# Patient Record
Sex: Female | Born: 1990 | Race: White | Hispanic: No | Marital: Married | Smoking: Current every day smoker
Health system: Southern US, Community
[De-identification: ages and names within clinical notes are randomized; demographics above are authoritative.]

## PROBLEM LIST (undated history)

## (undated) ENCOUNTER — Inpatient Hospital Stay (HOSPITAL_COMMUNITY): Payer: Self-pay

## (undated) DIAGNOSIS — Z8619 Personal history of other infectious and parasitic diseases: Secondary | ICD-10-CM

## (undated) DIAGNOSIS — E119 Type 2 diabetes mellitus without complications: Secondary | ICD-10-CM

## (undated) DIAGNOSIS — N200 Calculus of kidney: Secondary | ICD-10-CM

## (undated) DIAGNOSIS — B0089 Other herpesviral infection: Secondary | ICD-10-CM

## (undated) DIAGNOSIS — O24419 Gestational diabetes mellitus in pregnancy, unspecified control: Secondary | ICD-10-CM

## (undated) HISTORY — DX: Personal history of other infectious and parasitic diseases: Z86.19

## (undated) HISTORY — DX: Type 2 diabetes mellitus without complications: E11.9

## (undated) HISTORY — DX: Other herpesviral infection: B00.89

---
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
## 2011-07-30 ENCOUNTER — Encounter (HOSPITAL_COMMUNITY): Payer: Self-pay | Admitting: Emergency Medicine

## 2011-07-30 ENCOUNTER — Emergency Department (HOSPITAL_COMMUNITY): Payer: No Typology Code available for payment source

## 2011-07-30 ENCOUNTER — Emergency Department (HOSPITAL_COMMUNITY)
Admission: EM | Admit: 2011-07-30 | Discharge: 2011-07-30 | Disposition: A | Discharge status: Home / Self Care | Payer: No Typology Code available for payment source | Attending: Emergency Medicine | Admitting: Emergency Medicine

## 2011-07-30 DIAGNOSIS — IMO0002 Reserved for concepts with insufficient information to code with codable children: Secondary | ICD-10-CM | POA: Insufficient documentation

## 2011-07-30 DIAGNOSIS — S32009A Unspecified fracture of unspecified lumbar vertebra, initial encounter for closed fracture: Secondary | ICD-10-CM | POA: Insufficient documentation

## 2011-07-30 DIAGNOSIS — S32000A Wedge compression fracture of unspecified lumbar vertebra, initial encounter for closed fracture: Secondary | ICD-10-CM

## 2011-07-30 DIAGNOSIS — R404 Transient alteration of awareness: Secondary | ICD-10-CM | POA: Insufficient documentation

## 2011-07-30 DIAGNOSIS — T07XXXA Unspecified multiple injuries, initial encounter: Secondary | ICD-10-CM

## 2011-07-30 DIAGNOSIS — R109 Unspecified abdominal pain: Secondary | ICD-10-CM | POA: Insufficient documentation

## 2011-07-30 LAB — POCT PREGNANCY, URINE: Preg Test, Ur: NEGATIVE

## 2011-07-30 LAB — CBC
HCT: 43.5 % (ref 36.0–46.0)
Hemoglobin: 15.3 g/dL — ABNORMAL HIGH (ref 12.0–15.0)
MCV: 87.9 fL (ref 78.0–100.0)
RBC: 4.95 MIL/uL (ref 3.87–5.11)
RDW: 12.9 % (ref 11.5–15.5)

## 2011-07-30 LAB — BASIC METABOLIC PANEL
BUN: 10 mg/dL (ref 6–23)
CO2: 22 mEq/L (ref 19–32)
Chloride: 107 mEq/L (ref 96–112)
Creatinine, Ser: 0.44 mg/dL — ABNORMAL LOW (ref 0.50–1.10)
Glucose, Bld: 95 mg/dL (ref 70–99)

## 2011-07-30 MED ORDER — HYDROCODONE-ACETAMINOPHEN 5-500 MG PO TABS: 1.0000 | ORAL_TABLET | Freq: Four times a day (QID) | ORAL | Status: AC | PRN

## 2011-07-30 MED ORDER — IOHEXOL 300 MG/ML  SOLN
100.0000 mL | Freq: Once | INTRAMUSCULAR | Status: AC | PRN
  Administered 2011-07-30: 100 mL via INTRAVENOUS

## 2011-07-30 MED ORDER — NAPROXEN 500 MG PO TABS: 500.0000 mg | ORAL_TABLET | Freq: Two times a day (BID) | ORAL | Status: DC
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
## 2012-02-01 LAB — OB RESULTS CONSOLE GC/CHLAMYDIA
Chlamydia: NEGATIVE
Gonorrhea: NEGATIVE
Gonorrhea: NEGATIVE

## 2012-02-01 LAB — OB RESULTS CONSOLE RPR: RPR: NONREACTIVE

## 2012-02-01 LAB — OB RESULTS CONSOLE HIV ANTIBODY (ROUTINE TESTING): HIV: NONREACTIVE

## 2012-05-18 ENCOUNTER — Inpatient Hospital Stay (HOSPITAL_COMMUNITY)
Admission: AD | Admit: 2012-05-18 | Discharge: 2012-05-18 | Disposition: A | Discharge status: Home / Self Care | Payer: BC Managed Care – PPO | Source: Ambulatory Visit | Attending: Obstetrics and Gynecology | Admitting: Obstetrics and Gynecology

## 2012-05-18 ENCOUNTER — Encounter (HOSPITAL_COMMUNITY): Payer: Self-pay

## 2012-05-18 DIAGNOSIS — K5289 Other specified noninfective gastroenteritis and colitis: Secondary | ICD-10-CM | POA: Insufficient documentation

## 2012-05-18 DIAGNOSIS — G5600 Carpal tunnel syndrome, unspecified upper limb: Secondary | ICD-10-CM | POA: Insufficient documentation

## 2012-05-18 DIAGNOSIS — E876 Hypokalemia: Secondary | ICD-10-CM | POA: Insufficient documentation

## 2012-05-18 DIAGNOSIS — O99891 Other specified diseases and conditions complicating pregnancy: Secondary | ICD-10-CM | POA: Insufficient documentation

## 2012-05-18 DIAGNOSIS — G5603 Carpal tunnel syndrome, bilateral upper limbs: Secondary | ICD-10-CM

## 2012-05-18 DIAGNOSIS — IMO0002 Reserved for concepts with insufficient information to code with codable children: Secondary | ICD-10-CM | POA: Insufficient documentation

## 2012-05-18 DIAGNOSIS — K529 Noninfective gastroenteritis and colitis, unspecified: Secondary | ICD-10-CM

## 2012-05-18 DIAGNOSIS — R209 Unspecified disturbances of skin sensation: Secondary | ICD-10-CM | POA: Insufficient documentation

## 2012-05-18 HISTORY — DX: Calculus of kidney: N20.0

## 2012-05-18 LAB — CBC WITH DIFFERENTIAL/PLATELET
Basophils Absolute: 0 10*3/uL (ref 0.0–0.1)
Basophils Relative: 0 % (ref 0–1)
Eosinophils Absolute: 0 10*3/uL (ref 0.0–0.7)
HCT: 27.8 % — ABNORMAL LOW (ref 36.0–46.0)
Hemoglobin: 9.5 g/dL — ABNORMAL LOW (ref 12.0–15.0)
Lymphs Abs: 1.2 10*3/uL (ref 0.7–4.0)
MCHC: 34.2 g/dL (ref 30.0–36.0)
MCV: 86.1 fL (ref 78.0–100.0)
Monocytes Absolute: 0.6 10*3/uL (ref 0.1–1.0)
Monocytes Relative: 5 % (ref 3–12)

## 2012-05-18 LAB — COMPREHENSIVE METABOLIC PANEL
ALT: 15 U/L (ref 0–35)
Albumin: 2.5 g/dL — ABNORMAL LOW (ref 3.5–5.2)
Alkaline Phosphatase: 71 U/L (ref 39–117)
Calcium: 7.7 mg/dL — ABNORMAL LOW (ref 8.4–10.5)
GFR calc non Af Amer: >90 mL/min (ref >90)
Potassium: 2.5 mEq/L — CL (ref 3.5–5.1)
Total Bilirubin: 0.3 mg/dL (ref 0.3–1.2)

## 2012-05-18 LAB — URINALYSIS, ROUTINE W REFLEX MICROSCOPIC
Hgb urine dipstick: NEGATIVE
Leukocytes, UA: NEGATIVE
Specific Gravity, Urine: 1.01 (ref 1.005–1.030)
Urobilinogen, UA: 0.2 mg/dL (ref 0.0–1.0)

## 2012-05-18 MED ORDER — POTASSIUM CHLORIDE CRYS ER 20 MEQ PO TBCR
40.0000 meq | EXTENDED_RELEASE_TABLET | Freq: Once | ORAL | Status: AC
  Administered 2012-05-18: 40 meq via ORAL
  Filled 2012-05-18: qty 2

## 2012-05-18 MED ORDER — HYDROCODONE-ACETAMINOPHEN 5-325 MG PO TABS
2.0000 | ORAL_TABLET | Freq: Once | ORAL | Status: AC
  Administered 2012-05-18: 2 via ORAL
  Filled 2012-05-18: qty 2

## 2012-05-18 MED ORDER — CARPAL TUNNEL WRIST STABILIZER MISC: 1.0000 | Freq: Every day | Status: DC

## 2012-05-18 NOTE — MAU Note (Signed)
Readjusted cardio fhr 150

## 2012-05-18 NOTE — MAU Provider Note (Signed)
History     CSN: [id number]  Arrival date and time: 05/18/12 2011   First Provider Initiated Contact with Patient 05/18/12 2103      No chief complaint on file.  HPI 21 y.o. G1P0 at 109w2d with swelling and tingling in hands bilaterally for a few days. Started yesterday with mild swelling and a few episodes of tingling relieved when she got up and moved around. Today her hands feel very swollen and pain is constant - pins and needles- worse with any movement and stiff and swollen from wrist down. Any movement cause tingling/burning pain. Hard to move fingers. Has been sick with nausea/vomiting last night - other family members have had same illness.  No headaches except right after vomiting. Feels like eyes are twitching. Felt hot at home, but did not take temp. No dysuria. No ctx, bleeding, lof. Baby moving well.   OB History    Grav Para Term Preterm Abortions TAB SAB Ect Mult Living   1               Past Medical History  Diagnosis Date  . Kidney stones     age 21    No past surgical history on file.  Fam Hx:  HTN mom and dad  History  Substance Use Topics  . Smoking status: Current Every Day Smoker - 1 cig a day  . Smokeless tobacco: Not on file  . Alcohol Use: No    Allergies: No Known Allergies  Prescriptions prior to admission  Medication Sig Dispense Refill  . naproxen (NAPROSYN) 500 MG tablet Take 1 tablet (500 mg total) by mouth 2 (two) times daily with a meal.  30 tablet  0    Review of Systems  Constitutional: Negative for fever and chills.  Eyes: Negative for blurred vision and double vision.  Respiratory: Negative for cough, shortness of breath and wheezing.   Gastrointestinal: Positive for nausea, vomiting and diarrhea. Negative for abdominal pain and constipation.  Genitourinary: Negative for dysuria, urgency and frequency.  Musculoskeletal: Negative for back pain and joint pain.  Skin: Negative for rash.  Neurological: Negative for dizziness,  speech change, focal weakness, seizures, loss of consciousness and headaches.   Physical Exam   Blood pressure 145/65, pulse 107, temperature 98.5 F (36.9 C), resp. rate 18, last menstrual period 06/28/2011.  Physical Exam  Constitutional: She is oriented to person, place, and time. She appears well-developed and well-nourished. No distress.  HENT:  Head: Normocephalic and atraumatic.  Eyes: EOM are normal.       Left eye with small conjunctival hemorrhage. Otherwise normal.  Neck: Normal range of motion. Neck supple.       Non-tender.  Cardiovascular:       Mild tachycardia, regular, systolic flow murmur.  Respiratory: Effort normal and breath sounds normal. No respiratory distress. She has no wheezes. She has no rales.  GI: Soft. Bowel sounds are normal. There is no tenderness. There is no rebound and no guarding.  Musculoskeletal:       Holding hands flexed at wrist, fingers flexed at MCP joint but straight at PIP and DIP. Thumbs are flexed at MCP joints as well. Can flex and extend wrist and fingers at all joints but states it makes the tingling/numbness much worse. Stiff with passive range of motion. Joints not swollen, warm or painful. Bilateral edema - not warm or red. Feels "funny" when palpating elbows both condyles - feels more tingling in hands. Pulses intact. Sensation intact. Lower extremities  trace edema, non-tender, normal pulses.  Neurological: She is alert and oriented to person, place, and time.  Skin: Skin is warm and dry.  Psychiatric: She has a normal mood and affect.    FHTs:  145, moderate variability, accels present, occasional variable, no CTX.  Results for orders placed during the hospital encounter of 05/18/12 (from the past 24 hour(s))  URINALYSIS, ROUTINE W REFLEX MICROSCOPIC     Status: Normal   Collection Time   05/18/12  8:37 PM      Component Value Range   Color, Urine YELLOW  YELLOW   APPearance CLEAR  CLEAR   Specific Gravity, Urine 1.010  1.005  - 1.030   pH 7.0  5.0 - 8.0   Glucose, UA NEGATIVE  NEGATIVE mg/dL   Hgb urine dipstick NEGATIVE  NEGATIVE   Bilirubin Urine NEGATIVE  NEGATIVE   Ketones, ur NEGATIVE  NEGATIVE mg/dL   Protein, ur NEGATIVE  NEGATIVE mg/dL   Urobilinogen, UA 0.2  0.0 - 1.0 mg/dL   Nitrite NEGATIVE  NEGATIVE   Leukocytes, UA NEGATIVE  NEGATIVE  CBC WITH DIFFERENTIAL     Status: Abnormal   Collection Time   05/18/12  9:39 PM      Component Value Range   WBC 12.7 (*) 4.0 - 10.5 K/uL   RBC 3.23 (*) 3.87 - 5.11 MIL/uL   Hemoglobin 9.5 (*) 12.0 - 15.0 g/dL   HCT 16.1 (*) 09.6 - 04.5 %   MCV 86.1  78.0 - 100.0 fL   MCH 29.4  26.0 - 34.0 pg   MCHC 34.2  30.0 - 36.0 g/dL   RDW 40.9  81.1 - 91.4 %   Platelets 284  150 - 400 K/uL   Neutrophils Relative 85 (*) 43 - 77 %   Neutro Abs 10.8 (*) 1.7 - 7.7 K/uL   Lymphocytes Relative 10 (*) 12 - 46 %   Lymphs Abs 1.2  0.7 - 4.0 K/uL   Monocytes Relative 5  3 - 12 %   Monocytes Absolute 0.6  0.1 - 1.0 K/uL   Eosinophils Relative 0  0 - 5 %   Eosinophils Absolute 0.0  0.0 - 0.7 K/uL   Basophils Relative 0  0 - 1 %   Basophils Absolute 0.0  0.0 - 0.1 K/uL  COMPREHENSIVE METABOLIC PANEL     Status: Abnormal   Collection Time   05/18/12  9:39 PM      Component Value Range   Sodium 136  135 - 145 mEq/L   Potassium 2.5 (*) 3.5 - 5.1 mEq/L   Chloride 98  96 - 112 mEq/L   CO2 27  19 - 32 mEq/L   Glucose, Bld 87  70 - 99 mg/dL   BUN 7  6 - 23 mg/dL   Creatinine, Ser 7.82 (*) 0.50 - 1.10 mg/dL   Calcium 7.7 (*) 8.4 - 10.5 mg/dL   Total Protein 5.9 (*) 6.0 - 8.3 g/dL   Albumin 2.5 (*) 3.5 - 5.2 g/dL   AST 18  0 - 37 U/L   ALT 15  0 - 35 U/L   Alkaline Phosphatase 71  39 - 117 U/L   Total Bilirubin 0.3  0.3 - 1.2 mg/dL   GFR calc non Af Amer >90  >90 mL/min   GFR calc Af Amer >90  >90 mL/min    Filed Vitals:   05/18/12 2049 05/18/12 2139 05/18/12 2321 05/18/12 2322  BP: 145/65 125/52 126/58 126/58  Pulse: 107  96 91 91  Temp: 98.5 F (36.9 C)   98.8 F  (37.1 C)  Resp: 18  16 16      MAU Course  Procedures  Vicodin and ice pack given in MAU. Patient states the ice helped a lot.   Assessment and Plan  21 y.o. G1P0 at [redacted]w[redacted]d with - Swelling and neuropathy in hands bilaterally - likely carpal tunnel syndrome. Rx for bilateral wrist splints given. - Hypokalemia - likely from gastroenteritis. Replaced with 40 meq PO. F/U in clinic Monday to check electrolytes - Gastroenteritis - appears to be improving. Encouraged fluids, bland diet until tolerating PO. - Initially elevated BP but all repeat BP normal. Recheck in clinic next visit.  Discussed with Dr. Claiborne Billings. Discharge home.  Napoleon Form 05/18/2012, 9:05 PM

## 2012-05-18 NOTE — MAU Note (Signed)
Onset of swelling in hands started a couple of days has been in bed with a stomach virus, worse today, feels tingling, hurts can't bend hands.

## 2012-05-18 NOTE — MAU Note (Signed)
Received critical value on Potassium 2.5 notified Dr. Thad Ranger.

## 2012-05-22 NOTE — L&D Delivery Note (Signed)
Delivery Note At 2:50 PM a viable and healthy female was delivered via Vaginal, Vacuum extractor at outlet to shorten the 2nd stage.  (Presentation: Right Occiput Anterior).  APGAR: 8, 9; weight 7 lb 1.6 oz (3220 g).   Placenta status: Intact, Spontaneous.  Cord: 3 vessels.  Anesthesia: Epidural  Episiotomy: None Lacerations: 2nd degree;Perineal Suture Repair: 3.0 chromic Est. Blood Loss (mL): 300  Mom to postpartum.  Baby to nursery-stable.  KAPLAN,RICHARD D 07/30/2012, 5:50 PM

## 2012-06-12 ENCOUNTER — Encounter: Payer: Self-pay | Admitting: *Deleted

## 2012-06-12 ENCOUNTER — Encounter: Payer: Medicaid Other | Attending: Obstetrics and Gynecology | Admitting: *Deleted

## 2012-06-12 DIAGNOSIS — Z713 Dietary counseling and surveillance: Secondary | ICD-10-CM | POA: Insufficient documentation

## 2012-06-12 DIAGNOSIS — O99810 Abnormal glucose complicating pregnancy: Secondary | ICD-10-CM | POA: Insufficient documentation

## 2012-06-12 NOTE — Patient Instructions (Signed)
Goals:  Check glucose levels per MD as instructed  Follow Gestational Diabetes Diet as instructed  Call for follow-up as needed    

## 2012-06-12 NOTE — Progress Notes (Signed)
  Patient was seen on 06/12/2012 for Gestational Diabetes self-management class at the [facility]. The following learning objectives were met by the patient during this course:   States the definition of Gestational Diabetes  States why dietary management is important in controlling blood glucose  Describes the effects each nutrient has on blood glucose levels  Demonstrates ability to create a balanced meal plan  Demonstrates carbohydrate counting   States when to check blood glucose levels  Demonstrates proper blood glucose monitoring techniques  States the effect of stress and exercise on blood glucose levels  States the importance of limiting caffeine and abstaining from alcohol and smoking  Blood glucose monitor given: Accu Chek Nano BG Monitoring Kit Lot # [id number] Exp: 09/18/13 Blood glucose reading: 99 mg/dl  Patient instructed to monitor glucose levels: FBS: 60 - <90 2 hour: <120  *Patient received handouts:  Nutrition Diabetes and Pregnancy  Carbohydrate Counting List  Patient will be seen for follow-up as needed.

## 2012-07-06 ENCOUNTER — Observation Stay (HOSPITAL_COMMUNITY)
Admission: AD | Admit: 2012-07-06 | Discharge: 2012-07-06 | Disposition: A | Discharge status: Home / Self Care | Payer: BC Managed Care – PPO | Source: Ambulatory Visit | Attending: Obstetrics and Gynecology | Admitting: Obstetrics and Gynecology

## 2012-07-06 ENCOUNTER — Encounter (HOSPITAL_COMMUNITY): Payer: Self-pay | Admitting: *Deleted

## 2012-07-06 DIAGNOSIS — O4700 False labor before 37 completed weeks of gestation, unspecified trimester: Principal | ICD-10-CM | POA: Insufficient documentation

## 2012-07-06 DIAGNOSIS — R030 Elevated blood-pressure reading, without diagnosis of hypertension: Secondary | ICD-10-CM | POA: Insufficient documentation

## 2012-07-06 DIAGNOSIS — O99891 Other specified diseases and conditions complicating pregnancy: Secondary | ICD-10-CM | POA: Insufficient documentation

## 2012-07-06 DIAGNOSIS — O479 False labor, unspecified: Secondary | ICD-10-CM

## 2012-07-06 DIAGNOSIS — O99810 Abnormal glucose complicating pregnancy: Secondary | ICD-10-CM | POA: Insufficient documentation

## 2012-07-06 LAB — URINE MICROSCOPIC-ADD ON

## 2012-07-06 LAB — URINALYSIS, ROUTINE W REFLEX MICROSCOPIC
Bilirubin Urine: NEGATIVE
Ketones, ur: 15 mg/dL — AB
Leukocytes, UA: NEGATIVE
Nitrite: NEGATIVE
Urobilinogen, UA: 0.2 mg/dL (ref 0.0–1.0)

## 2012-07-06 LAB — LACTATE DEHYDROGENASE: LDH: 183 U/L (ref 94–250)

## 2012-07-06 LAB — CBC
Platelets: 338 10*3/uL (ref 150–400)
RDW: 13.4 % (ref 11.5–15.5)
WBC: 18.3 10*3/uL — ABNORMAL HIGH (ref 4.0–10.5)

## 2012-07-06 MED ORDER — LACTATED RINGERS IV SOLN: INTRAVENOUS | Status: DC

## 2012-07-06 MED ORDER — NIFEDIPINE 10 MG PO CAPS
10.0000 mg | ORAL_CAPSULE | Freq: Four times a day (QID) | ORAL | Status: DC
  Administered 2012-07-06: 10 mg via ORAL
  Filled 2012-07-06: qty 1

## 2012-07-06 MED ORDER — NIFEDIPINE 10 MG PO CAPS
10.0000 mg | ORAL_CAPSULE | Freq: Once | ORAL | Status: AC
  Administered 2012-07-06: 10 mg via ORAL
  Filled 2012-07-06: qty 1

## 2012-07-06 MED ORDER — LACTATED RINGERS IV SOLN
INTRAVENOUS | Status: DC
Start: 2012-07-06 — End: 2012-07-06
  Administered 2012-07-06: 05:00:00 via INTRAVENOUS

## 2012-07-06 MED ORDER — NIFEDIPINE 10 MG PO CAPS: 10.0000 mg | ORAL_CAPSULE | Freq: Four times a day (QID) | ORAL | Status: DC

## 2012-07-06 NOTE — MAU Note (Signed)
Contractions every 5 mins. Ctxs were earlier today at doctor's office but I wasn't dilated.

## 2012-07-06 NOTE — Discharge Summary (Signed)
Physician Discharge Summary  Patient ID: Kathryn Gilbert MRN: [id number] DOB/AGE: [birth year] 21 y.o.  Admit date: 07/06/2012 Discharge date: 07/06/2012  Admission Diagnoses:preterm contractions, elevated blood pressures  Discharge Diagnoses: same Active Problems:   * No active hospital problems. *   Discharged Condition: good  Hospital Course: Observed o/n.  Ctxes decreased.  Bps better.  No proteinuria.  Consults: None  Significant Diagnostic Studies: labs:  Results for orders placed during the hospital encounter of 07/06/12 (from the past 24 hour(s))  URINALYSIS, ROUTINE W REFLEX MICROSCOPIC     Status: Abnormal   Collection Time    07/06/12  2:01 AM      Result Value Range   Color, Urine YELLOW  YELLOW   APPearance CLEAR  CLEAR   Specific Gravity, Urine <1.005 (*) 1.005 - 1.030   pH 6.5  5.0 - 8.0   Glucose, UA NEGATIVE  NEGATIVE mg/dL   Hgb urine dipstick TRACE (*) NEGATIVE   Bilirubin Urine NEGATIVE  NEGATIVE   Ketones, ur 15 (*) NEGATIVE mg/dL   Protein, ur NEGATIVE  NEGATIVE mg/dL   Urobilinogen, UA 0.2  0.0 - 1.0 mg/dL   Nitrite NEGATIVE  NEGATIVE   Leukocytes, UA NEGATIVE  NEGATIVE  URINE MICROSCOPIC-ADD ON     Status: None   Collection Time    07/06/12  2:01 AM      Result Value Range   Squamous Epithelial / LPF RARE  RARE   WBC, UA 0-2  <3 WBC/hpf   RBC / HPF 0-2  <3 RBC/hpf  LACTATE DEHYDROGENASE     Status: None   Collection Time    07/06/12  5:00 AM      Result Value Range   LDH 183  94 - 250 U/L  CBC     Status: Abnormal   Collection Time    07/06/12  5:00 AM      Result Value Range   WBC 18.3 (*) 4.0 - 10.5 K/uL   RBC 3.74 (*) 3.87 - 5.11 MIL/uL   Hemoglobin 10.7 (*) 12.0 - 15.0 g/dL   HCT 13.2 (*) 44.0 - 10.2 %   MCV 84.2  78.0 - 100.0 fL   MCH 28.6  26.0 - 34.0 pg   MCHC 34.0  30.0 - 36.0 g/dL   RDW 72.5  36.6 - 44.0 %   Platelets 338  150 - 400 K/uL  URIC ACID     Status: None   Collection Time    07/06/12  5:00 AM      Result Value  Range   Uric Acid, Serum 4.4  2.4 - 7.0 mg/dL    Treatments: procardia   Disposition: 01-Home or Self Care  Discharge Orders   Future Orders Complete By Expires     Discharge activity:  Up to eat  As directed     Discharge diet:  No restrictions  As directed     Discharge instructions  As directed     Comments:      Modified bedrest.  Refrain from intercourse.  Count baby's movements in 1 hour per day- if you don't get 6 in that hour, call.    Do not have sex or do anything that might make you have an orgasm  As directed     Fetal Kick Count:  Lie on our left side for one hour after a meal, and count the number of times your baby kicks.  If it is less than 5 times, get up, move around and  drink some juice.  Repeat the test 30 minutes later.  If it is still less than 5 kicks in an hour, notify your doctor.  As directed     LABOR:  When conractions begin, you should start to time them from the beginning of one contraction to the beginning  of the next.  When contractions are 5 - 10 minutes apart or less and have been regular for at least an hour, you should call your health care provider.  As directed     Notify physician for bleeding from the vagina  As directed     Notify physician for blurring of vision or spots before the eyes  As directed     Notify physician for chills or fever  As directed     Notify physician for fainting spells, "black outs" or loss of consciousness  As directed     Notify physician for increase in vaginal discharge  As directed     Notify physician for leaking of fluid  As directed     Notify physician for pain or burning when urinating  As directed     Notify physician for pelvic pressure (sudden increase)  As directed     Notify physician for severe or continued nausea or vomiting  As directed     Notify physician for sudden gushing of fluid from the vagina (with or without continued leaking)  As directed     Notify physician for sudden, constant, or occasional  abdominal pain  As directed     Notify physician if baby moving less than usual  As directed         Medication List    TAKE these medications       NIFEdipine 10 MG capsule  Commonly known as:  PROCARDIA  Take 1 capsule (10 mg total) by mouth every 6 (six) hours.     prenatal multivitamin Tabs  Take 1 tablet by mouth at bedtime.           Follow-up Information   Follow up with [facility] In 1 week.   Contact information:   [address] [place] [address] [phone]      Signed: Loney Laurence 07/06/2012, 10:57 AM

## 2012-07-06 NOTE — Progress Notes (Signed)
To 155 via w/c

## 2012-07-06 NOTE — Progress Notes (Signed)
Report called to Dana RN in BS.  

## 2012-07-06 NOTE — Progress Notes (Signed)
Admission discussed. Pt up to BR.

## 2012-07-06 NOTE — Progress Notes (Signed)
Pt comfortable.  Filed Vitals:   07/06/12 0345 07/06/12 0400 07/06/12 0534 07/06/12 0826  BP: 151/81 151/84 149/85 134/63  Pulse: 84 89 85 75  Temp:   98.5 F (36.9 C) 98.4 F (36.9 C)  TempSrc:   Oral Oral  Resp:   18 18  Height:   5\' 4"  (1.626 m)   Weight:   85.73 kg (189 lb)    FHTs 120s gSTV, NST R, class 1 Toco occ q 10-20  SVE deferred.  Results for orders placed during the hospital encounter of 07/06/12 (from the past 48 hour(s))  URINALYSIS, ROUTINE W REFLEX MICROSCOPIC     Status: Abnormal   Collection Time    07/06/12  2:01 AM      Result Value Range   Color, Urine YELLOW  YELLOW   APPearance CLEAR  CLEAR   Specific Gravity, Urine <1.005 (*) 1.005 - 1.030   pH 6.5  5.0 - 8.0   Glucose, UA NEGATIVE  NEGATIVE mg/dL   Hgb urine dipstick TRACE (*) NEGATIVE   Bilirubin Urine NEGATIVE  NEGATIVE   Ketones, ur 15 (*) NEGATIVE mg/dL   Protein, ur NEGATIVE  NEGATIVE mg/dL   Urobilinogen, UA 0.2  0.0 - 1.0 mg/dL   Nitrite NEGATIVE  NEGATIVE   Leukocytes, UA NEGATIVE  NEGATIVE  URINE MICROSCOPIC-ADD ON     Status: None   Collection Time    07/06/12  2:01 AM      Result Value Range   Squamous Epithelial / LPF RARE  RARE   WBC, UA 0-2  <3 WBC/hpf   RBC / HPF 0-2  <3 RBC/hpf  LACTATE DEHYDROGENASE     Status: None   Collection Time    07/06/12  5:00 AM      Result Value Range   LDH 183  94 - 250 U/L  CBC     Status: Abnormal   Collection Time    07/06/12  5:00 AM      Result Value Range   WBC 18.3 (*) 4.0 - 10.5 K/uL   RBC 3.74 (*) 3.87 - 5.11 MIL/uL   Hemoglobin 10.7 (*) 12.0 - 15.0 g/dL   HCT 84.6 (*) 96.2 - 95.2 %   MCV 84.2  78.0 - 100.0 fL   MCH 28.6  26.0 - 34.0 pg   MCHC 34.0  30.0 - 36.0 g/dL   RDW 84.1  32.4 - 40.1 %   Platelets 338  150 - 400 K/uL  URIC ACID     Status: None   Collection Time    07/06/12  5:00 AM      Result Value Range   Uric Acid, Serum 4.4  2.4 - 7.0 mg/dL    A/P [redacted]w[redacted]d with U2VOZ and now preterm contractions. Elevated BPs  were likely result of pain and anxiety.  BPs better now.  Will keep pt on Procardia 10mg  q6 hours and d/c to home.

## 2012-07-06 NOTE — MAU Provider Note (Signed)
I have seen and examined the patient and I agree with above H&P.

## 2012-07-06 NOTE — MAU Provider Note (Signed)
History     CSN: [id number]  Arrival date and time: 07/06/12 0125   First Provider Initiated Contact with Patient 07/06/12 0257      Chief Complaint  Patient presents with  . Contractions   HPI Kathryn Gilbert 22 y.o. [redacted]w[redacted]d  Was seen in the office today and was having contractions but her cervix was not dialated.  Has continued to have contractions.  Comes tonight as contractions are 4-5 minutes apart.  No leaking.  No bleeding.  OB History   Grav Para Term Preterm Abortions TAB SAB Ect Mult Living   1               Past Medical History  Diagnosis Date  . Kidney stones     age 8  . Diabetes mellitus without complication     History reviewed. No pertinent past surgical history.  Family History  Problem Relation Age of Onset  . Hypertension Mother   . Hypertension Father     History  Substance Use Topics  . Smoking status: Former Games developer  . Smokeless tobacco: Not on file  . Alcohol Use: No    Allergies: No Known Allergies  Prescriptions prior to admission  Medication Sig Dispense Refill  . Elastic Bandages & Supports (CARPAL TUNNEL WRIST STABILIZER) MISC Apply 1 each topically daily.  2 each  0  . Prenatal Vit-Fe Fumarate-FA (PRENATAL MULTIVITAMIN) TABS Take 1 tablet by mouth at bedtime.      Marland Kitchen acetaminophen (TYLENOL) 500 MG tablet Take 500 mg by mouth every 6 (six) hours as needed for pain.        Review of Systems  Constitutional: Negative for fever.  Eyes: Negative for blurred vision.  Gastrointestinal: Negative for nausea and vomiting.       Contractions.  Genitourinary:       No vaginal discharge. No vaginal bleeding. No dysuria.  Musculoskeletal:       No ankle edema.  Reflexes 2+   Neurological: Negative for headaches.   Physical Exam   Blood pressure 149/83, pulse 95, temperature 97.9 F (36.6 C), resp. rate 20, height 5' 4.5" (1.638 m), weight 86.002 kg (189 lb 9.6 oz), last menstrual period 06/28/2011.  Physical Exam  Nursing note and  vitals reviewed. Constitutional: She is oriented to person, place, and time. She appears well-developed and well-nourished. No distress.  HENT:  Head: Normocephalic.  Eyes: EOM are normal.  Neck: Neck supple.  GI: Soft. There is no tenderness.  Contractions are 3-4 minutes.  FHT baseline 135 and reactive with 15x15 accels.  Musculoskeletal: Normal range of motion.  Neurological: She is alert and oriented to person, place, and time.  Skin: Skin is warm and dry.  Psychiatric: She has a normal mood and affect.    MAU Course  Procedures Results for orders placed during the hospital encounter of 07/06/12 (from the past 24 hour(s))  URINALYSIS, ROUTINE W REFLEX MICROSCOPIC     Status: Abnormal   Collection Time    07/06/12  2:01 AM      Result Value Range   Color, Urine YELLOW  YELLOW   APPearance CLEAR  CLEAR   Specific Gravity, Urine <1.005 (*) 1.005 - 1.030   pH 6.5  5.0 - 8.0   Glucose, UA NEGATIVE  NEGATIVE mg/dL   Hgb urine dipstick TRACE (*) NEGATIVE   Bilirubin Urine NEGATIVE  NEGATIVE   Ketones, ur 15 (*) NEGATIVE mg/dL   Protein, ur NEGATIVE  NEGATIVE mg/dL   Urobilinogen, UA 0.2  0.0 - 1.0 mg/dL   Nitrite NEGATIVE  NEGATIVE   Leukocytes, UA NEGATIVE  NEGATIVE  URINE MICROSCOPIC-ADD ON     Status: None   Collection Time    07/06/12  2:01 AM      Result Value Range   Squamous Epithelial / LPF RARE  RARE   WBC, UA 0-2  <3 WBC/hpf   RBC / HPF 0-2  <3 RBC/hpf   MDM 0310  Consult with Dr. Henderson Cloud adn reviewed plan of care.  Blood pressure is elevated.  Last BP 158/101 0430   Will place in 24 hr observation as BP continues to be elevated and contractions are continuing.  Assessment and Plan  Preterm contractions  Elevated BP  Plan Procardia 10 mg po for BP and for contractions. Will observe for 24 hours.  BURLESON,TERRI 07/06/2012, 3:10 AM

## 2012-07-08 NOTE — Progress Notes (Signed)
Post discharge review completed. 

## 2012-07-11 ENCOUNTER — Inpatient Hospital Stay (HOSPITAL_COMMUNITY)
Admission: AD | Admit: 2012-07-11 | Discharge: 2012-07-11 | Disposition: A | Discharge status: Home / Self Care | Payer: BC Managed Care – PPO | Source: Ambulatory Visit | Attending: Obstetrics and Gynecology | Admitting: Obstetrics and Gynecology

## 2012-07-11 ENCOUNTER — Encounter (HOSPITAL_COMMUNITY): Payer: Self-pay | Admitting: *Deleted

## 2012-07-11 DIAGNOSIS — R030 Elevated blood-pressure reading, without diagnosis of hypertension: Secondary | ICD-10-CM | POA: Insufficient documentation

## 2012-07-11 DIAGNOSIS — O99891 Other specified diseases and conditions complicating pregnancy: Secondary | ICD-10-CM | POA: Insufficient documentation

## 2012-07-11 DIAGNOSIS — R109 Unspecified abdominal pain: Secondary | ICD-10-CM | POA: Insufficient documentation

## 2012-07-11 DIAGNOSIS — O4700 False labor before 37 completed weeks of gestation, unspecified trimester: Secondary | ICD-10-CM | POA: Insufficient documentation

## 2012-07-11 HISTORY — DX: Gestational diabetes mellitus in pregnancy, unspecified control: O24.419

## 2012-07-11 LAB — COMPREHENSIVE METABOLIC PANEL
ALT: 13 U/L (ref 0–35)
AST: 16 U/L (ref 0–37)
Albumin: 2.7 g/dL — ABNORMAL LOW (ref 3.5–5.2)
Alkaline Phosphatase: 123 U/L — ABNORMAL HIGH (ref 39–117)
CO2: 20 mEq/L (ref 19–32)
Chloride: 103 mEq/L (ref 96–112)
Creatinine, Ser: 0.54 mg/dL (ref 0.50–1.10)
GFR calc non Af Amer: >90 mL/min (ref >90)
Potassium: 4 mEq/L (ref 3.5–5.1)
Sodium: 133 mEq/L — ABNORMAL LOW (ref 135–145)
Total Bilirubin: 0.1 mg/dL — ABNORMAL LOW (ref 0.3–1.2)

## 2012-07-11 LAB — URINALYSIS, ROUTINE W REFLEX MICROSCOPIC
Bilirubin Urine: NEGATIVE
Glucose, UA: NEGATIVE mg/dL
Protein, ur: NEGATIVE mg/dL
Urobilinogen, UA: 0.2 mg/dL (ref 0.0–1.0)

## 2012-07-11 LAB — CBC
MCV: 84.4 fL (ref 78.0–100.0)
Platelets: 363 10*3/uL (ref 150–400)
RBC: 3.72 MIL/uL — ABNORMAL LOW (ref 3.87–5.11)
RDW: 13.6 % (ref 11.5–15.5)
WBC: 16.7 10*3/uL — ABNORMAL HIGH (ref 4.0–10.5)

## 2012-07-11 LAB — URINE MICROSCOPIC-ADD ON

## 2012-07-11 NOTE — MAU Provider Note (Signed)
Chief Complaint:  No chief complaint on file.   First Provider Initiated Contact with Patient 07/11/12 1830      HPI: Kathryn Gilbert is a 22 y.o. G1P0 at [redacted]w[redacted]d on Procardia for PT UCs who to reports increased uterine activity since 5 days ago with lower abdominal cramping that is constant. Denies  leakage of fluid or vaginal bleeding. Good fetal movement. Yellow-tinged vaginal discharge is non-irritative. Denies dysuria, frequency or urgency of urination. No headache or visual disturbance.  Pregnancy Course: Threatened PTL and A1 GDM   Past Medical History: Past Medical History  Diagnosis Date  . Kidney stones     age 29  . Diabetes mellitus without complication   . Gestational diabetes     Past obstetric history: OB History   Grav Para Term Preterm Abortions TAB SAB Ect Mult Living   1              # Outc Date GA Lbr Len/2nd Wgt Sex Del Anes PTL Lv   1 CUR               Past Surgical History: History reviewed. No pertinent past surgical history.  Family History: Family History  Problem Relation Age of Onset  . Hypertension Mother   . Hypertension Father     Social History: History  Substance Use Topics  . Smoking status: Former Games developer  . Smokeless tobacco: Not on file  . Alcohol Use: No    Allergies: No Known Allergies  Meds:  Prescriptions prior to admission  Medication Sig Dispense Refill  . NIFEdipine (PROCARDIA) 10 MG capsule Take 1 capsule (10 mg total) by mouth every 6 (six) hours.  60 capsule  2  . Prenatal Vit-Fe Fumarate-FA (PRENATAL MULTIVITAMIN) TABS Take 1 tablet by mouth at bedtime.        ROS: Pertinent findings in history of present illness.  Physical Exam  Blood pressure 149/81, pulse 78, temperature 97.5 F (36.4 C), temperature source Oral, last menstrual period 06/28/2011. Filed Vitals:   07/11/12 1807 07/11/12 1816 07/11/12 1831  BP: 147/87 143/77 149/81  Pulse: 83 78 78  Temp: 97.5 F (36.4 C)    TempSrc: Oral     GENERAL:  Well-developed, well-nourished female in no acute distress.  HEENT: normocephalic HEART: normal rate RESP: normal effort ABDOMEN: Soft, non-tender, gravid appropriate for gestational age, UCs palpate mild-moderate EXTREMITIES: Nontender, no edema NEURO: alert and oriented  Dilation: Fingertip Effacement (%): Thick Cervical Position: Posterior Station: Ballotable Presentation: Vertex Exam by:: D.Alonzo Owczarzak,CNM Internal os closed. Physiologic discharge.  FHT:  Baseline 125 , moderate variability, accelerations present, no decelerations Contractions: q 8-10 mins   Labs: Results for orders placed during the hospital encounter of 07/11/12 (from the past 24 hour(s))  CBC     Status: Abnormal   Collection Time    07/11/12  6:55 PM      Result Value Range   WBC 16.7 (*) 4.0 - 10.5 K/uL   RBC 3.72 (*) 3.87 - 5.11 MIL/uL   Hemoglobin 10.7 (*) 12.0 - 15.0 g/dL   HCT 16.1 (*) 09.6 - 04.5 %   MCV 84.4  78.0 - 100.0 fL   MCH 28.8  26.0 - 34.0 pg   MCHC 34.1  30.0 - 36.0 g/dL   RDW 40.9  81.1 - 91.4 %   Platelets 363  150 - 400 K/uL  COMPREHENSIVE METABOLIC PANEL     Status: Abnormal   Collection Time    07/11/12  6:55 PM  Result Value Range   Sodium 133 (*) 135 - 145 mEq/L   Potassium 4.0  3.5 - 5.1 mEq/L   Chloride 103  96 - 112 mEq/L   CO2 20  19 - 32 mEq/L   Glucose, Bld 98  70 - 99 mg/dL   BUN 9  6 - 23 mg/dL   Creatinine, Ser 1.61  0.50 - 1.10 mg/dL   Calcium 9.3  8.4 - 09.6 mg/dL   Total Protein 6.1  6.0 - 8.3 g/dL   Albumin 2.7 (*) 3.5 - 5.2 g/dL   AST 16  0 - 37 U/L   ALT 13  0 - 35 U/L   Alkaline Phosphatase 123 (*) 39 - 117 U/L   Total Bilirubin 0.1 (*) 0.3 - 1.2 mg/dL   GFR calc non Af Amer >90  >90 mL/min   GFR calc Af Amer >90  >90 mL/min    Assessment: 1. Threatened preterm labor, antepartum   2. Elevated BP   G1 @[redacted]w[redacted]d   Plan: C/W Callahan: Home with 24 hour urine to bring to office and preE precautions Labor precautions and fetal kick counts     Medication List    ASK your doctor about these medications       NIFEdipine 10 MG capsule  Commonly known as:  PROCARDIA  Take 1 capsule (10 mg total) by mouth every 6 (six) hours.     prenatal multivitamin Tabs  Take 1 tablet by mouth at bedtime.        Danae Orleans, CNM 07/11/2012 6:43 PM

## 2012-07-11 NOTE — MAU Note (Signed)
Pt reports having contractions since Friday. On procardia with some relief. Told to come back for more monitoring

## 2012-07-16 LAB — OB RESULTS CONSOLE GBS: GBS: NEGATIVE

## 2012-07-22 ENCOUNTER — Encounter (HOSPITAL_COMMUNITY): Payer: Self-pay | Admitting: *Deleted

## 2012-07-22 ENCOUNTER — Telehealth (HOSPITAL_COMMUNITY): Payer: Self-pay | Admitting: *Deleted

## 2012-07-22 NOTE — Telephone Encounter (Signed)
Preadmission screen  

## 2012-07-29 ENCOUNTER — Inpatient Hospital Stay (HOSPITAL_COMMUNITY)
Admission: AD | Admit: 2012-07-29 | Discharge: 2012-08-01 | DRG: 372 | Disposition: A | Discharge status: Home / Self Care | Payer: BC Managed Care – PPO | Source: Ambulatory Visit | Attending: Obstetrics and Gynecology | Admitting: Obstetrics and Gynecology

## 2012-07-29 ENCOUNTER — Encounter (HOSPITAL_COMMUNITY): Payer: Self-pay

## 2012-07-29 ENCOUNTER — Inpatient Hospital Stay (HOSPITAL_COMMUNITY): Payer: BC Managed Care – PPO | Admitting: Anesthesiology

## 2012-07-29 ENCOUNTER — Inpatient Hospital Stay (HOSPITAL_COMMUNITY): Admission: RE | Admit: 2012-07-29 | Payer: BC Managed Care – PPO | Source: Ambulatory Visit

## 2012-07-29 ENCOUNTER — Encounter (HOSPITAL_COMMUNITY): Payer: Self-pay | Admitting: Anesthesiology

## 2012-07-29 DIAGNOSIS — O99814 Abnormal glucose complicating childbirth: Secondary | ICD-10-CM | POA: Diagnosis present

## 2012-07-29 DIAGNOSIS — IMO0002 Reserved for concepts with insufficient information to code with codable children: Principal | ICD-10-CM | POA: Diagnosis present

## 2012-07-29 LAB — CBC
MCH: 28.3 pg (ref 26.0–34.0)
MCHC: 34.1 g/dL (ref 30.0–36.0)
MCV: 83 fL (ref 78.0–100.0)
MCV: 82.9 fL (ref 78.0–100.0)
Platelets: 304 10*3/uL (ref 150–400)
Platelets: 290 10*3/uL (ref 150–400)
RBC: 3.85 MIL/uL — ABNORMAL LOW (ref 3.87–5.11)
RDW: 14 % (ref 11.5–15.5)
WBC: 17.3 10*3/uL — ABNORMAL HIGH (ref 4.0–10.5)

## 2012-07-29 LAB — COMPREHENSIVE METABOLIC PANEL
AST: 16 U/L (ref 0–37)
Albumin: 2.7 g/dL — ABNORMAL LOW (ref 3.5–5.2)
BUN: 11 mg/dL (ref 6–23)
CO2: 20 mEq/L (ref 19–32)
Calcium: 10.9 mg/dL — ABNORMAL HIGH (ref 8.4–10.5)
Creatinine, Ser: 0.74 mg/dL (ref 0.50–1.10)
GFR calc non Af Amer: >90 mL/min (ref >90)
Total Bilirubin: 0.2 mg/dL — ABNORMAL LOW (ref 0.3–1.2)

## 2012-07-29 LAB — PROTEIN / CREATININE RATIO, URINE
Creatinine, Urine: 64.86 mg/dL
Protein Creatinine Ratio: 0.24 — ABNORMAL HIGH (ref 0.00–0.15)
Total Protein, Urine: 15.6 mg/dL

## 2012-07-29 LAB — OB RESULTS CONSOLE ANTIBODY SCREEN: Antibody Screen: NEGATIVE

## 2012-07-29 MED ORDER — FENTANYL 2.5 MCG/ML BUPIVACAINE 1/10 % EPIDURAL INFUSION (WH - ANES)
14.0000 mL/h | INTRAMUSCULAR | Status: DC | PRN
  Administered 2012-07-29 – 2012-07-30 (×2): 14 mL/h via EPIDURAL
  Filled 2012-07-29 (×2): qty 125

## 2012-07-29 MED ORDER — BUTORPHANOL TARTRATE 1 MG/ML IJ SOLN
1.0000 mg | INTRAMUSCULAR | Status: DC | PRN
  Administered 2012-07-29 (×2): 1 mg via INTRAVENOUS
  Filled 2012-07-29 (×2): qty 1

## 2012-07-29 MED ORDER — LIDOCAINE HCL (PF) 1 % IJ SOLN
30.0000 mL | INTRAMUSCULAR | Status: AC | PRN
  Administered 2012-07-30: 30 mL via SUBCUTANEOUS
  Filled 2012-07-29 (×2): qty 30

## 2012-07-29 MED ORDER — LACTATED RINGERS IV SOLN
500.0000 mL | Freq: Once | INTRAVENOUS | Status: AC
  Administered 2012-07-29: 500 mL via INTRAVENOUS

## 2012-07-29 MED ORDER — PHENYLEPHRINE 40 MCG/ML (10ML) SYRINGE FOR IV PUSH (FOR BLOOD PRESSURE SUPPORT)
80.0000 ug | PREFILLED_SYRINGE | INTRAVENOUS | Status: DC | PRN
  Filled 2012-07-29: qty 5

## 2012-07-29 MED ORDER — LACTATED RINGERS IV SOLN: 500.0000 mL | INTRAVENOUS | Status: DC | PRN

## 2012-07-29 MED ORDER — ONDANSETRON HCL 4 MG/2ML IJ SOLN: 4.0000 mg | Freq: Four times a day (QID) | INTRAMUSCULAR | Status: DC | PRN

## 2012-07-29 MED ORDER — CITRIC ACID-SODIUM CITRATE 334-500 MG/5ML PO SOLN: 30.0000 mL | ORAL | Status: DC | PRN

## 2012-07-29 MED ORDER — PHENYLEPHRINE 40 MCG/ML (10ML) SYRINGE FOR IV PUSH (FOR BLOOD PRESSURE SUPPORT): 80.0000 ug | PREFILLED_SYRINGE | INTRAVENOUS | Status: DC | PRN

## 2012-07-29 MED ORDER — OXYTOCIN BOLUS FROM INFUSION: 500.0000 mL | INTRAVENOUS | Status: DC

## 2012-07-29 MED ORDER — OXYTOCIN 40 UNITS IN LACTATED RINGERS INFUSION - SIMPLE MED
62.5000 mL/h | INTRAVENOUS | Status: DC
  Filled 2012-07-29: qty 1000

## 2012-07-29 MED ORDER — DIPHENHYDRAMINE HCL 50 MG/ML IJ SOLN
12.5000 mg | INTRAMUSCULAR | Status: DC | PRN
  Administered 2012-07-29: 12.5 mg via INTRAVENOUS
  Filled 2012-07-29: qty 1

## 2012-07-29 MED ORDER — ACETAMINOPHEN 325 MG PO TABS: 650.0000 mg | ORAL_TABLET | ORAL | Status: DC | PRN

## 2012-07-29 MED ORDER — EPHEDRINE 5 MG/ML INJ: 10.0000 mg | INTRAVENOUS | Status: DC | PRN

## 2012-07-29 MED ORDER — IBUPROFEN 600 MG PO TABS
600.0000 mg | ORAL_TABLET | Freq: Four times a day (QID) | ORAL | Status: DC | PRN
  Filled 2012-07-29: qty 1

## 2012-07-29 MED ORDER — LACTATED RINGERS IV SOLN
INTRAVENOUS | Status: DC
  Administered 2012-07-29 – 2012-07-30 (×3): via INTRAVENOUS

## 2012-07-29 MED ORDER — MISOPROSTOL 25 MCG QUARTER TABLET
25.0000 ug | ORAL_TABLET | ORAL | Status: DC
  Administered 2012-07-29: 25 ug via VAGINAL
  Filled 2012-07-29 (×2): qty 1
  Filled 2012-07-29: qty 0.25
  Filled 2012-07-29 (×3): qty 1

## 2012-07-29 MED ORDER — LIDOCAINE HCL (PF) 1 % IJ SOLN
INTRAMUSCULAR | Status: DC | PRN
  Administered 2012-07-29 (×2): 5 mL

## 2012-07-29 MED ORDER — OXYCODONE-ACETAMINOPHEN 5-325 MG PO TABS: 1.0000 | ORAL_TABLET | ORAL | Status: DC | PRN

## 2012-07-29 NOTE — MAU Note (Signed)
Patient states she is scheduled for IOL tonight for elevated blood pressure. States at 0530 this am she had a gush of clear fluid and continues to pass "tissue" with some bloody show. Reports good fetal movement. Denies any headaches, blurred vision or spots.

## 2012-07-29 NOTE — H&P (Signed)
Kathryn Gilbert is a 22 y.o. female presenting for evaluation of leaking fluid.  22 year old G1 P0 at 37+4 weeks estimated gestational age presents to maternity admissions for evaluation of leaking fluid. The patient was confirmed to not be spontaneously ruptured.  She was scheduled for induction on the evening of the day for mild preeclampsia based on severe range blood pressures and a 24-hour urine of greater than 440 mg about a week ago. Giving you a ACOG guidelines recommending induction of labor after 37 weeks the patient was admitted for scheduled induction. Additionally, she is a gestational diabetic. She was recently started on Glyburide 2.5 mg at bedtime due to elevated fasting blood sugars. Estimated fetal weight on ultrasound approximately a week ago was 84th percentile. History OB History   Grav Para Term Preterm Abortions TAB SAB Ect Mult Living   1              Past Medical History  Diagnosis Date  . Kidney stones     age 60  . Diabetes mellitus without complication   . Gestational diabetes   . Hx of varicella   . Herpes simplex virus type 1 (HSV-1) dermatitis    History reviewed. No pertinent past surgical history. Family History: family history includes Cancer in her mother and Hypertension in her father and mother. Social History:  reports that she quit smoking about 6 months ago. She has never used smokeless tobacco. She reports that she does not drink alcohol or use illicit drugs.   Prenatal Transfer Tool  Maternal Diabetes: Yes:  Diabetes Type:  Insulin/Medication controlled Genetic Screening: Normal Maternal Ultrasounds/Referrals: Normal Fetal Ultrasounds or other Referrals:  None Maternal Substance Abuse:  No Significant Maternal Medications:  None Significant Maternal Lab Results:  Lab values include: Other:  24 hour urine 440mg  Other Comments:  None  ROS: as above  Dilation: 2 Effacement (%): 100 Station: -1 Exam by:: A. Tuttle, RNC Blood pressure  156/108, pulse 79, temperature 98.1 F (36.7 C), temperature source Oral, resp. rate 18, height 5' 3.5" (1.613 m), weight 87 kg (191 lb 12.8 oz), last menstrual period 06/28/2011, SpO2 100.00%. Exam Physical Exam  Prenatal labs: ABO, Rh: O/Positive/-- (09/12 0000) Antibody: Negative (03/10 0000) Rubella: Immune (09/12 0000) RPR: Nonreactive (12/12 0000)  HBsAg: Negative (09/12 0000)  HIV: Non-reactive (12/12 0000)  GBS: Negative (02/25 0000)   Assessment/Plan: 1) Admit 2) Pre-eclampsia labs 3) Misoprostol 25 mg Q4 hrs 4) Labetalol prn 5) CBG Q4 hr in latent labor, Q1 hr in active  Kathryn Gilbert H. 07/29/2012, 11:40 PM

## 2012-07-29 NOTE — Anesthesia Preprocedure Evaluation (Signed)
Anesthesia Evaluation  Patient identified by MRN, date of birth, ID band Patient awake    Reviewed: Allergy & Precautions, H&P , Patient's Chart, lab work & pertinent test results  Airway Mallampati: III TM Distance: >3 FB Neck ROM: full    Dental no notable dental hx.    Pulmonary neg pulmonary ROS,  breath sounds clear to auscultation  Pulmonary exam normal       Cardiovascular hypertension, negative cardio ROS  Rhythm:regular Rate:Normal     Neuro/Psych negative neurological ROS  negative psych ROS   GI/Hepatic negative GI ROS, Neg liver ROS,   Endo/Other  negative endocrine ROSdiabetes  Renal/GU negative Renal ROS     Musculoskeletal   Abdominal   Peds  Hematology negative hematology ROS (+)   Anesthesia Other Findings Kidney stones   age 22 Diabetes mellitus without complication        Gestational diabetes     Hx of varicella        Herpes simplex virus type 1 (HSV-1) dermatitis    Reproductive/Obstetrics (+) Pregnancy                           Anesthesia Physical Anesthesia Plan  ASA: III  Anesthesia Plan: Epidural   Post-op Pain Management:    Induction:   Airway Management Planned:   Additional Equipment:   Intra-op Plan:   Post-operative Plan:   Informed Consent: I have reviewed the patients History and Physical, chart, labs and discussed the procedure including the risks, benefits and alternatives for the proposed anesthesia with the patient or authorized representative who has indicated his/her understanding and acceptance.     Plan Discussed with:   Anesthesia Plan Comments:         Anesthesia Quick Evaluation

## 2012-07-29 NOTE — Anesthesia Procedure Notes (Signed)

## 2012-07-29 NOTE — MAU Provider Note (Signed)
Chief Complaint:  Rupture of Membranes   First Provider Initiated Contact with Patient 07/29/12 1432      HPI: Kathryn Gilbert is a 22 y.o. G1P0 at [redacted]w[redacted]d who presents to maternity admissions reporting leakage of clear fluid this morning and some mucous discharge for a few hours but no additional clear fluid.  She has also been contracting, described as mild to moderate and regular, since yesterday.  She reports good fetal movement, denies vaginal bleeding, vaginal itching/burning, urinary symptoms, h/a, epigastric pain, visual disturbances, n/v, or fever/chills.  Her pregnancy history is significant for CHTN and GDM, with IOL scheduled tonight for HTN.    Past Medical History: Past Medical History  Diagnosis Date  . Kidney stones     age 54  . Diabetes mellitus without complication   . Gestational diabetes   . Hx of varicella   . Herpes simplex virus type 1 (HSV-1) dermatitis     Past obstetric history: OB History   Grav Para Term Preterm Abortions TAB SAB Ect Mult Living   1              # Outc Date GA Lbr Len/2nd Wgt Sex Del Anes PTL Lv   1 CUR               Past Surgical History: History reviewed. No pertinent past surgical history.  Family History: Family History  Problem Relation Age of Onset  . Hypertension Mother   . Cancer Mother     skin  . Hypertension Father     Social History: History  Substance Use Topics  . Smoking status: Former Smoker    Quit date: 01/23/2012  . Smokeless tobacco: Never Used  . Alcohol Use: No    Allergies: No Known Allergies  Meds:  Prescriptions prior to admission  Medication Sig Dispense Refill  . glyBURIDE (DIABETA) 2.5 MG tablet Take 2.5 mg by mouth at bedtime.      Marland Kitchen NIFEdipine (PROCARDIA) 10 MG capsule Take 1 capsule (10 mg total) by mouth every 6 (six) hours.  60 capsule  2  . Prenatal Vit-Fe Fumarate-FA (PRENATAL MULTIVITAMIN) TABS Take 1 tablet by mouth at bedtime.        ROS: Pertinent findings in history of  present illness.  Physical Exam  Blood pressure 166/99, pulse 97, temperature 98.6 F (37 C), temperature source Oral, resp. rate 20, height 5' 3.5" (1.613 m), weight 87 kg (191 lb 12.8 oz), last menstrual period 06/28/2011, SpO2 100.00%. Patient Vitals for the past 24 hrs:  BP Temp Temp src Pulse Resp SpO2 Height Weight  07/29/12 1737 157/89 mmHg 98 F (36.7 C) Oral 75 18 - - -  07/29/12 1715 - - - - 18 - - -  07/29/12 1654 134/70 mmHg - - 76 - - - -  07/29/12 1537 151/94 mmHg - - 83 - - - -  07/29/12 1446 159/86 mmHg - - 100 - - - -  07/29/12 1430 167/106 mmHg - - 99 - - - -  07/29/12 1415 166/99 mmHg - - 97 - - - -  07/29/12 1411 161/109 mmHg - - 103 - - - -  07/29/12 1352 172/97 mmHg 98.6 F (37 C) Oral 103 20 100 % 5' 3.5" (1.613 m) 87 kg (191 lb 12.8 oz)   GENERAL: Well-developed, well-nourished female in no acute distress.  HEENT: normocephalic HEART: normal rate RESP: normal effort ABDOMEN: Soft, non-tender, gravid appropriate for gestational age EXTREMITIES: Nontender, no edema NEURO: alert and  oriented Pelvic exam: Cervix pink, visually closed, without lesion, scant white creamy discharge, negative pooling with cough/bearing down, vaginal walls and external genitalia normal, no HSV lesions visible, no symptoms reported by pt  Dilation: 2 Effacement (%): 80 Cervical Position: Middle Exam by:: Sharen Counter CNM  FHT:  Baseline 135 , moderate variability, accelerations present, no decelerations Contractions: q 3-5 mins   Ferning negative  Assessment: Chronic hypertension Gestational diabetes  Plan: Called Dr Tenny Craw to discuss assessment and findings Admit to birthing suites for labs, continue IOL as planned Preeclampsia labs drawn along with routine admission labs     Medication List    ASK your doctor about these medications       glyBURIDE 2.5 MG tablet  Commonly known as:  DIABETA  Take 2.5 mg by mouth at bedtime.     NIFEdipine 10 MG capsule   Commonly known as:  PROCARDIA  Take 1 capsule (10 mg total) by mouth every 6 (six) hours.     prenatal multivitamin Tabs  Take 1 tablet by mouth at bedtime.        Sharen Counter Certified Nurse-Midwife 07/29/2012 3:23 PM

## 2012-07-29 NOTE — Progress Notes (Signed)
Iv attempt by charge nurse unsuccessful.  That made 3 attempts total.  [facility] called to iv start, and no one can come at this time.  Will come when available.

## 2012-07-30 ENCOUNTER — Encounter (HOSPITAL_COMMUNITY): Payer: Self-pay

## 2012-07-30 LAB — CBC
Hemoglobin: 11 g/dL — ABNORMAL LOW (ref 12.0–15.0)
MCH: 28.6 pg (ref 26.0–34.0)
MCHC: 34.8 g/dL (ref 30.0–36.0)
MCV: 82.3 fL (ref 78.0–100.0)
Platelets: 275 10*3/uL (ref 150–400)
RBC: 3.84 MIL/uL — ABNORMAL LOW (ref 3.87–5.11)

## 2012-07-30 LAB — GLUCOSE, CAPILLARY
Glucose-Capillary: 101 mg/dL — ABNORMAL HIGH (ref 70–99)
Glucose-Capillary: 60 mg/dL — ABNORMAL LOW (ref 70–99)

## 2012-07-30 LAB — ABO/RH: ABO/RH(D): O POS

## 2012-07-30 LAB — RPR: RPR Ser Ql: NONREACTIVE

## 2012-07-30 MED ORDER — DIPHENHYDRAMINE HCL 25 MG PO CAPS: 25.0000 mg | ORAL_CAPSULE | Freq: Four times a day (QID) | ORAL | Status: DC | PRN

## 2012-07-30 MED ORDER — WITCH HAZEL-GLYCERIN EX PADS: 1.0000 "application " | MEDICATED_PAD | CUTANEOUS | Status: DC | PRN

## 2012-07-30 MED ORDER — IBUPROFEN 600 MG PO TABS
600.0000 mg | ORAL_TABLET | Freq: Four times a day (QID) | ORAL | Status: DC
  Administered 2012-07-30 – 2012-08-01 (×8): 600 mg via ORAL
  Filled 2012-07-30 (×8): qty 1

## 2012-07-30 MED ORDER — TETANUS-DIPHTH-ACELL PERTUSSIS 5-2.5-18.5 LF-MCG/0.5 IM SUSP
0.5000 mL | Freq: Once | INTRAMUSCULAR | Status: AC
  Administered 2012-07-31: 0.5 mL via INTRAMUSCULAR
  Filled 2012-07-30: qty 0.5

## 2012-07-30 MED ORDER — OXYCODONE-ACETAMINOPHEN 5-325 MG PO TABS
1.0000 | ORAL_TABLET | ORAL | Status: DC | PRN
  Administered 2012-08-01: 1 via ORAL
  Filled 2012-07-30: qty 1

## 2012-07-30 MED ORDER — ONDANSETRON HCL 4 MG/2ML IJ SOLN: 4.0000 mg | INTRAMUSCULAR | Status: DC | PRN

## 2012-07-30 MED ORDER — ONDANSETRON HCL 4 MG PO TABS: 4.0000 mg | ORAL_TABLET | ORAL | Status: DC | PRN

## 2012-07-30 MED ORDER — BENZOCAINE-MENTHOL 20-0.5 % EX AERO
1.0000 "application " | INHALATION_SPRAY | CUTANEOUS | Status: DC | PRN
  Administered 2012-07-30: 1 via TOPICAL
  Filled 2012-07-30: qty 56

## 2012-07-30 MED ORDER — PRENATAL MULTIVITAMIN CH
1.0000 | ORAL_TABLET | Freq: Every day | ORAL | Status: DC
  Administered 2012-07-31 – 2012-08-01 (×2): 1 via ORAL
  Filled 2012-07-30 (×3): qty 1

## 2012-07-30 MED ORDER — DIBUCAINE 1 % RE OINT: 1.0000 "application " | TOPICAL_OINTMENT | RECTAL | Status: DC | PRN

## 2012-07-30 MED ORDER — ZOLPIDEM TARTRATE 5 MG PO TABS: 5.0000 mg | ORAL_TABLET | Freq: Every evening | ORAL | Status: DC | PRN

## 2012-07-30 MED ORDER — LANOLIN HYDROUS EX OINT: TOPICAL_OINTMENT | CUTANEOUS | Status: DC | PRN

## 2012-07-30 MED ORDER — SENNOSIDES-DOCUSATE SODIUM 8.6-50 MG PO TABS: 2.0000 | ORAL_TABLET | Freq: Every day | ORAL | Status: DC

## 2012-07-30 MED ORDER — SIMETHICONE 80 MG PO CHEW: 80.0000 mg | CHEWABLE_TABLET | ORAL | Status: DC | PRN

## 2012-07-30 NOTE — Progress Notes (Signed)
Dr Arlyce Dice notified of mat BPs. Provider to place orders for prn labetolol, and ordered not to give labetolol for last BP.

## 2012-07-30 NOTE — Progress Notes (Signed)
Delivery of live viable female by Dr Arlyce Dice per vacuum. RROB RN at bedside APGARs 8,9

## 2012-07-30 NOTE — Progress Notes (Signed)
Dr Arlyce Dice notified again of increased pt BPs; indicated that these were consistent, that there was no orders for prn meds, and that there was a lack of labwork. No orders given at this time.

## 2012-07-30 NOTE — Progress Notes (Signed)
S: Comfortable after epidural O: Filed Vitals:   07/29/12 2345 07/29/12 2348 07/29/12 2353 07/29/12 2355  BP: 146/80   146/89  Pulse: 72 67 72 68  Temp:      TempSrc:      Resp:      Height:      Weight:      SpO2:       Comfortable  FHT 140 reactive cvx 4/c/-2, BBOW toco Q1-2  A/P 1) No additional misoprostol or pitocin at this time.  Pt responded well to one dose of misoprostol.  If no cervical change after 4 hrs will start low dose pit 2) FWB reassuring 3) BPs controlled without meds at this time

## 2012-07-31 LAB — CBC
Hemoglobin: 10.7 g/dL — ABNORMAL LOW (ref 12.0–15.0)
MCH: 28.5 pg (ref 26.0–34.0)
MCHC: 34.6 g/dL (ref 30.0–36.0)
RDW: 14 % (ref 11.5–15.5)

## 2012-07-31 LAB — GLUCOSE, RANDOM: Glucose, Bld: 94 mg/dL (ref 70–99)

## 2012-07-31 NOTE — Anesthesia Postprocedure Evaluation (Signed)
Anesthesia Post Note  Patient: Kathryn Gilbert  Procedure(s) Performed: * No procedures listed *  Anesthesia type: Epidural  Patient location: Mother/Baby  Post pain: Pain level controlled  Post assessment: Post-op Vital signs reviewed  Last Vitals:  Filed Vitals:   07/31/12 0525  BP: 143/83  Pulse: 73  Temp: 36.4 C  Resp: 18    Post vital signs: Reviewed  Level of consciousness: awake  Complications: No apparent anesthesia complications

## 2012-07-31 NOTE — Progress Notes (Signed)
Patient is eating, ambulating, voiding.  Pain control is good.  No HA, vision change, RUQ pain.  No other complaints.  Filed Vitals:   07/30/12 1845 07/30/12 1945 07/30/12 2329 07/31/12 0525  BP: 144/76 144/68 146/81 143/83  Pulse: 92 84 81 73  Temp: 98.6 F (37 C) 99 F (37.2 C) 98.2 F (36.8 C) 97.5 F (36.4 C)  TempSrc: Oral Oral Oral Oral  Resp: 20 20 18 18   Height:      Weight:      SpO2:        Fundus firm Perineum without swelling. No CT  Lab Results  Component Value Date   WBC 24.4* 07/31/2012   HGB 10.7* 07/31/2012   HCT 30.9* 07/31/2012   MCV 82.2 07/31/2012   PLT 270 07/31/2012    --/--/O POS (03/11 1605)  A/P Post partum day 1. Preeclampsia - mainly mild with several severe range BPs, monitor  Routine care.    Kathryn Gilbert

## 2012-08-01 NOTE — Progress Notes (Signed)
PPD#2 Pt without complaints. Ready for discharge. B/Ps- mildly elevated, has history of PIH Fundus- non tender IMP/ stable PLAN/ Will discharge, recheck in office in 3-4 days. Told to call office if B/P >150/95

## 2012-08-01 NOTE — Discharge Summary (Signed)
Obstetric Discharge Summary Reason for Admission: induction of labor Prenatal Procedures: NST and ultrasound Intrapartum Procedures: vacuum Postpartum Procedures: none Complications-Operative and Postpartum: 2 degree perineal laceration Hemoglobin  Date Value Range Status  07/31/2012 10.7* 12.0 - 15.0 g/dL Final     HCT  Date Value Range Status  07/31/2012 30.9* 36.0 - 46.0 % Final    Physical Exam:  General: alert Lochia: appropriate Uterine Fundus: firm   Discharge Diagnoses: Term Pregnancy-delivered, Preelampsia and gestational diabetes  Discharge Information: Date: 08/01/2012 Activity: pelvic rest Diet: routine Medications: PNV Condition: stable Instructions: refer to practice specific booklet Discharge to: home Follow-up Information   Follow up with Mickel Baas, MD. Schedule an appointment as soon as possible for a visit in 4 days.   Contact information:   [address] [place] [address] [phone]       Newborn Data: Live born female  Birth Weight: 7 lb 1.6 oz (3220 g) APGAR: 8, 9  Home with mother.  ANDERSON,MARK E 08/01/2012, 9:00 AM

## 2012-08-05 ENCOUNTER — Ambulatory Visit (HOSPITAL_COMMUNITY)
Admit: 2012-08-05 | Discharge: 2012-08-05 | Disposition: A | Discharge status: Home / Self Care | Payer: BC Managed Care – PPO | Attending: Obstetrics and Gynecology | Admitting: Obstetrics and Gynecology

## 2012-08-05 NOTE — Lactation Note (Signed)
Adult Lactation Consultation Outpatient Visit Note  Patient Name: Kathryn Gilbert    Baby name: [name] Date of Birth: 11/14/1990     DOB: [birth year] Gestational Age at Delivery: Unknown   BW: 7# 1.6 oz (3220g)   Type of Delivery: VAVD     D/c wt: 6# 10.3 oz (down 6%)           Today's weight: 6# 8.6 oz Breastfeeding History: Frequency of Breastfeeding: 8 times/day Length of Feeding: 25 min   Voids: 8/day Stools: None in 4 days (meconium was last seen in hospital)  Supplementing / Method: Pumping:  Type of Pump:Manual    Frequency: once/twice since going home  Volume: 1 oz or less  Consultation Evaluation:  Initial Feeding Assessment: Pre-feed Weight: 2966g Post-feed Weight: 2974g Amount Transferred: 8mL Comments: L breast, no nipple shield, for about 20 min  Additional Feeding Assessment: Pre-feed [id number] Post-feed Weight: 2996g Amount Transferred: 22mL Comments: R breast, size 20 nipple shield  Total Breast milk Transferred this Visit:30 mL Total Supplement Given: 12 (formula)  Follow-Up  Baby has gained 2.5 oz over the last 3 days; however, baby is jaundiced through thighs & has not stooled in 4 days.  Mom also reports that baby falls asleep too easily at the breast.    Mom states her milk came in on 3/15.  Her milk squirts out w/hand expression.  However, I feel that Mom has more milk-making potential based on assessment of her breasts (milk ducts are palpable, but there is not the fullness one would expect).  Mom's nipples atraumatic.  Mom was sometimes using a NS #24, which was too big for the baby's mouth.  A size 20 nipple shield was substituted & Mom commented that it felt better.  Baby nursed much better (baby fed at bare breast for the 1st side). After going to the 2nd breast, baby took 12ccs of formula.  A [employer] loaner pump was provided & Mom was shown how to use it.  Mom also shown how to apply nipple shield & how to clean it.  Mom will feed baby at  breast, pump after feedings, and then feed EBM to baby via bottle.  Mom is aware that if baby is still hungry, then she will need to give formula. No pacifier to be used at this time, so that baby will not be underfed.  Baby to lab for bili check (order sent via Pam at [employer]).  Mom to f/u w/Lactation on 08/09/12 @ 2:30p to further follow baby's ability to feed at breast, etc. Mom understands to call w/any questions.   Lurline Hare [facility] 08/05/2012, 9:08 AM

## 2012-08-09 ENCOUNTER — Ambulatory Visit (HOSPITAL_COMMUNITY)
Admission: RE | Admit: 2012-08-09 | Discharge: 2012-08-09 | Disposition: A | Discharge status: Home / Self Care | Payer: BC Managed Care – PPO | Source: Ambulatory Visit | Attending: Obstetrics and Gynecology | Admitting: Obstetrics and Gynecology

## 2012-08-09 NOTE — Lactation Note (Addendum)
Adult Lactation Consultation Outpatient Visit Note  Patient Name: Kathryn Gilbert Date of Birth: 09-16-1990 Gestational Age at Delivery: Unknown Type of Delivery: Vaginal Delivery 07/30/2012 -37 4/7 weeks.  Baby Kathryn Gilbert [employer]                            Birth weight- 7-1 oz                             1st Per visit - 6-6 oz                             3/21 6-10 oz ( per mom Pedis visit)                              Per mom Per Pedis the baby should be back up to birth weight by 3/26                             Per mom was on the Bili blanket from Monday evening until Wed. Evening                             Last reading was Wed. Afternoon- 15 , repeat this am at the Pedis  Breastfeeding History: Per mom -taking Fenugreek 2tabs 3X's per day , Blessed thistle 2 tabs 3 X's  per day - it has helped increase my milk supply since Monday.  Frequency of Breastfeeding: Every 1 1/2 to 3 1/2 hours ( Last night slept 5 hours )  Length of Feeding: 15 -20 mins, routinely doesn't take the 2nd breast , Per mom so I switch out. Voids: 10 wets  Stools: Last stool was Wed, evening after suppository ( 2 large dark green stools) .   Supplementing / Method: Pumping:  Type of Pump: [employer] loaner - Latina    Frequency: 2-3 X's per day 10-15 mins   Volume:  20-25 ml   Comments:- Per mom using the 27 Flange. Encouraged mom to check the #24 again and see if it is a good fit because her swelling has decreased.     Consultation Evaluation: Baby appear jaundice today ( per much , her color is so much better )   Initial Feeding Assessment: Left breast -cross cradle  Pre-feed Weight:6-10.3 oz 3012 g   Post-feed Weight:6-11.9 oz 3058g Amount Transferred: 46 ml  Comments: Baby latched with #20 NS ( mom was able apply correctly ), latched in cross cradle and sustained consistent pattern for 20 mins with multiply swallows , increased with breast compressions. Nipple appeared normal when baby released from the breast  . After feeding  baby wet X2, Re-weight see below   Additional Feeding Assessment:Right breast - football  Pre-feed Weight:6-11.2 oz 3038 g  Post-feed Weight: Amount Transferred: Comments: Mom applied #20 NS , and Lc assisted to obtain depth and baby fed for 20 mins , ina consistent swallowing pattern with multiply   swallows, increased with breast compressions.   Total Breast milk Transferred this Visit: 90ml  Total Supplement Given: None    Lactation Plan of Care- Keep up the great efforts Breast feeding and pumping                                        -  Feed with the Nipple shield - can start attempting to wean Kathryn Gilbert off the nipple shield                                       - If you are using the nipple shield - extra pumping is needed. After 4-6 feedings a day for 10-15 mins                                      - If Kathryn Gilbert feeds only on the 1st breast , don't allow the 2nd breast to get over full - protect your milk supply.  Follow up  -  F/u on Wednesday 3/26 with Dr.Wayne Anders Gilbert Pedis for weight check                                          _ F/U on Friday -3/28 230p at [facility]                                                 Kathryn Gilbert 08/09/2012, 2:51 PM

## 2012-08-16 ENCOUNTER — Ambulatory Visit (HOSPITAL_COMMUNITY): Admission: RE | Admit: 2012-08-16 | Payer: BC Managed Care – PPO | Source: Ambulatory Visit

## 2012-08-16 ENCOUNTER — Encounter (HOSPITAL_COMMUNITY): Payer: BC Managed Care – PPO

## 2014-03-23 ENCOUNTER — Encounter (HOSPITAL_COMMUNITY): Payer: Self-pay

## 2016-12-18 ENCOUNTER — Encounter (HOSPITAL_COMMUNITY): Payer: Self-pay

## 2016-12-18 ENCOUNTER — Emergency Department (HOSPITAL_COMMUNITY)
Admission: EM | Admit: 2016-12-18 | Discharge: 2016-12-18 | Disposition: A | Discharge status: Home / Self Care | Payer: BLUE CROSS/BLUE SHIELD | Attending: Emergency Medicine | Admitting: Emergency Medicine

## 2016-12-18 ENCOUNTER — Emergency Department (HOSPITAL_COMMUNITY): Payer: BLUE CROSS/BLUE SHIELD

## 2016-12-18 DIAGNOSIS — Y929 Unspecified place or not applicable: Secondary | ICD-10-CM | POA: Diagnosis not present

## 2016-12-18 DIAGNOSIS — S301XXA Contusion of abdominal wall, initial encounter: Secondary | ICD-10-CM | POA: Diagnosis not present

## 2016-12-18 DIAGNOSIS — S3991XA Unspecified injury of abdomen, initial encounter: Secondary | ICD-10-CM | POA: Diagnosis present

## 2016-12-18 DIAGNOSIS — Z87891 Personal history of nicotine dependence: Secondary | ICD-10-CM | POA: Insufficient documentation

## 2016-12-18 DIAGNOSIS — R1030 Lower abdominal pain, unspecified: Secondary | ICD-10-CM

## 2016-12-18 DIAGNOSIS — E109 Type 1 diabetes mellitus without complications: Secondary | ICD-10-CM | POA: Diagnosis not present

## 2016-12-18 DIAGNOSIS — Z794 Long term (current) use of insulin: Secondary | ICD-10-CM | POA: Diagnosis not present

## 2016-12-18 DIAGNOSIS — Y9389 Activity, other specified: Secondary | ICD-10-CM | POA: Diagnosis not present

## 2016-12-18 DIAGNOSIS — Y999 Unspecified external cause status: Secondary | ICD-10-CM | POA: Insufficient documentation

## 2016-12-18 DIAGNOSIS — Z79899 Other long term (current) drug therapy: Secondary | ICD-10-CM | POA: Diagnosis not present

## 2016-12-18 LAB — COMPREHENSIVE METABOLIC PANEL
ALBUMIN: 4 g/dL (ref 3.5–5.0)
ALT: 12 U/L — ABNORMAL LOW (ref 14–54)
AST: 20 U/L (ref 15–41)
Alkaline Phosphatase: 58 U/L (ref 38–126)
Anion gap: 7 (ref 5–15)
BUN: 8 mg/dL (ref 6–20)
CHLORIDE: 103 mmol/L (ref 101–111)
CO2: 25 mmol/L (ref 22–32)
Calcium: 9 mg/dL (ref 8.9–10.3)
Creatinine, Ser: 0.61 mg/dL (ref 0.44–1.00)
GFR calc Af Amer: >60 mL/min (ref >60)
GFR calc non Af Amer: >60 mL/min (ref >60)
GLUCOSE: 225 mg/dL — AB (ref 65–99)
POTASSIUM: 3.8 mmol/L (ref 3.5–5.1)
Sodium: 135 mmol/L (ref 135–145)
Total Bilirubin: 0.2 mg/dL — ABNORMAL LOW (ref 0.3–1.2)
Total Protein: 7.3 g/dL (ref 6.5–8.1)

## 2016-12-18 LAB — URINALYSIS, ROUTINE W REFLEX MICROSCOPIC
Bilirubin Urine: NEGATIVE
Glucose, UA: >=500 mg/dL — AB
KETONES UR: NEGATIVE mg/dL
LEUKOCYTES UA: NEGATIVE
Nitrite: NEGATIVE
PH: 6 (ref 5.0–8.0)
Protein, ur: NEGATIVE mg/dL
Specific Gravity, Urine: 1.015 (ref 1.005–1.030)

## 2016-12-18 LAB — CBC
HEMATOCRIT: 40.2 % (ref 36.0–46.0)
Hemoglobin: 13.5 g/dL (ref 12.0–15.0)
MCH: 28.8 pg (ref 26.0–34.0)
MCHC: 33.6 g/dL (ref 30.0–36.0)
MCV: 85.7 fL (ref 78.0–100.0)
Platelets: 328 10*3/uL (ref 150–400)
RBC: 4.69 MIL/uL (ref 3.87–5.11)
RDW: 13.1 % (ref 11.5–15.5)
WBC: 8.2 10*3/uL (ref 4.0–10.5)

## 2016-12-18 LAB — CBG MONITORING, ED
GLUCOSE-CAPILLARY: 346 mg/dL — AB (ref 65–99)
GLUCOSE-CAPILLARY: 53 mg/dL — AB (ref 65–99)
Glucose-Capillary: 99 mg/dL (ref 65–99)

## 2016-12-18 LAB — I-STAT BETA HCG BLOOD, ED (MC, WL, AP ONLY)

## 2016-12-18 MED ORDER — MORPHINE SULFATE (PF) 4 MG/ML IV SOLN
2.0000 mg | Freq: Once | INTRAVENOUS | Status: AC
  Administered 2016-12-18: 2 mg via INTRAVENOUS
  Filled 2016-12-18: qty 1

## 2016-12-18 MED ORDER — ACETAMINOPHEN 325 MG PO TABS: 650.0000 mg | ORAL_TABLET | Freq: Four times a day (QID) | ORAL | 0 refills | Status: DC | PRN

## 2016-12-18 MED ORDER — IOPAMIDOL (ISOVUE-300) INJECTION 61%
INTRAVENOUS | Status: AC
  Administered 2016-12-18: 100 mL
  Filled 2016-12-18: qty 100

## 2016-12-18 MED ORDER — ONDANSETRON HCL 4 MG/2ML IJ SOLN
4.0000 mg | Freq: Once | INTRAMUSCULAR | Status: AC
  Administered 2016-12-18: 4 mg via INTRAVENOUS
  Filled 2016-12-18: qty 2

## 2016-12-18 MED ORDER — SODIUM CHLORIDE 0.9 % IV BOLUS (SEPSIS)
1000.0000 mL | Freq: Once | INTRAVENOUS | Status: AC
Start: 2016-12-18 — End: 2016-12-18
  Administered 2016-12-18: 1000 mL via INTRAVENOUS

## 2016-12-18 NOTE — ED Notes (Signed)
cbg was 346

## 2016-12-18 NOTE — ED Notes (Signed)
cbg was 53. RN and Dr made aware. Per Dr pt provided with orange juice.
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
## 2016-12-18 NOTE — ED Notes (Signed)
Pt transported to CT ?
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
## 2016-12-18 NOTE — ED Notes (Signed)
Pt A&OX4, ambulatory at d/c with independent steady gait, NAD. Pts father will be driving her home.

## 2016-12-26 ENCOUNTER — Emergency Department (HOSPITAL_COMMUNITY): Payer: BLUE CROSS/BLUE SHIELD

## 2016-12-26 ENCOUNTER — Encounter (HOSPITAL_COMMUNITY): Payer: Self-pay | Admitting: Emergency Medicine

## 2016-12-26 ENCOUNTER — Emergency Department (HOSPITAL_COMMUNITY)
Admission: EM | Admit: 2016-12-26 | Discharge: 2016-12-26 | Discharge status: Against Medical Advice | Payer: BLUE CROSS/BLUE SHIELD | Attending: Physician Assistant | Admitting: Physician Assistant

## 2016-12-26 DIAGNOSIS — Z87891 Personal history of nicotine dependence: Secondary | ICD-10-CM | POA: Insufficient documentation

## 2016-12-26 DIAGNOSIS — N939 Abnormal uterine and vaginal bleeding, unspecified: Secondary | ICD-10-CM | POA: Diagnosis present

## 2016-12-26 DIAGNOSIS — Z794 Long term (current) use of insulin: Secondary | ICD-10-CM | POA: Insufficient documentation

## 2016-12-26 DIAGNOSIS — E119 Type 2 diabetes mellitus without complications: Secondary | ICD-10-CM | POA: Diagnosis not present

## 2016-12-26 DIAGNOSIS — Z79899 Other long term (current) drug therapy: Secondary | ICD-10-CM | POA: Insufficient documentation

## 2016-12-26 LAB — CBC
HEMATOCRIT: 37.9 % (ref 36.0–46.0)
HEMOGLOBIN: 12.9 g/dL (ref 12.0–15.0)
MCH: 28.8 pg (ref 26.0–34.0)
MCHC: 34 g/dL (ref 30.0–36.0)
MCV: 84.6 fL (ref 78.0–100.0)
Platelets: 338 10*3/uL (ref 150–400)
RBC: 4.48 MIL/uL (ref 3.87–5.11)
RDW: 12.9 % (ref 11.5–15.5)
WBC: 9.6 10*3/uL (ref 4.0–10.5)

## 2016-12-26 LAB — POC URINE PREG, ED: PREG TEST UR: NEGATIVE

## 2016-12-26 LAB — COMPREHENSIVE METABOLIC PANEL
ALT: 13 U/L — ABNORMAL LOW (ref 14–54)
AST: 24 U/L (ref 15–41)
Albumin: 3.7 g/dL (ref 3.5–5.0)
Alkaline Phosphatase: 59 U/L (ref 38–126)
Anion gap: 11 (ref 5–15)
BUN: 10 mg/dL (ref 6–20)
CALCIUM: 8.8 mg/dL — AB (ref 8.9–10.3)
CHLORIDE: 97 mmol/L — AB (ref 101–111)
CO2: 22 mmol/L (ref 22–32)
Creatinine, Ser: 0.91 mg/dL (ref 0.44–1.00)
Glucose, Bld: 415 mg/dL — ABNORMAL HIGH (ref 65–99)
POTASSIUM: 4.1 mmol/L (ref 3.5–5.1)
Sodium: 130 mmol/L — ABNORMAL LOW (ref 135–145)
Total Bilirubin: 0.5 mg/dL (ref 0.3–1.2)
Total Protein: 6.8 g/dL (ref 6.5–8.1)

## 2016-12-26 LAB — LIPASE, BLOOD: LIPASE: 24 U/L (ref 11–51)
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
## 2017-01-01 ENCOUNTER — Encounter (HOSPITAL_COMMUNITY): Payer: Self-pay | Admitting: Emergency Medicine

## 2017-01-01 ENCOUNTER — Emergency Department (HOSPITAL_COMMUNITY)
Admission: EM | Admit: 2017-01-01 | Discharge: 2017-01-01 | Disposition: A | Discharge status: Home / Self Care | Payer: BLUE CROSS/BLUE SHIELD | Attending: Emergency Medicine | Admitting: Emergency Medicine

## 2017-01-01 ENCOUNTER — Emergency Department (HOSPITAL_COMMUNITY): Payer: BLUE CROSS/BLUE SHIELD

## 2017-01-01 DIAGNOSIS — Z79899 Other long term (current) drug therapy: Secondary | ICD-10-CM | POA: Diagnosis not present

## 2017-01-01 DIAGNOSIS — R1110 Vomiting, unspecified: Secondary | ICD-10-CM | POA: Insufficient documentation

## 2017-01-01 DIAGNOSIS — E119 Type 2 diabetes mellitus without complications: Secondary | ICD-10-CM | POA: Diagnosis not present

## 2017-01-01 DIAGNOSIS — F13230 Sedative, hypnotic or anxiolytic dependence with withdrawal, uncomplicated: Secondary | ICD-10-CM

## 2017-01-01 DIAGNOSIS — F13930 Sedative, hypnotic or anxiolytic use, unspecified with withdrawal, uncomplicated: Secondary | ICD-10-CM

## 2017-01-01 DIAGNOSIS — F17210 Nicotine dependence, cigarettes, uncomplicated: Secondary | ICD-10-CM | POA: Insufficient documentation

## 2017-01-01 DIAGNOSIS — R569 Unspecified convulsions: Secondary | ICD-10-CM | POA: Insufficient documentation

## 2017-01-01 DIAGNOSIS — Z7984 Long term (current) use of oral hypoglycemic drugs: Secondary | ICD-10-CM | POA: Insufficient documentation

## 2017-01-01 LAB — CBC WITH DIFFERENTIAL/PLATELET
Basophils Absolute: 0.1 10*3/uL (ref 0.0–0.1)
Basophils Relative: 0 %
EOS ABS: 0.1 10*3/uL (ref 0.0–0.7)
EOS PCT: 1 %
HEMATOCRIT: 41.8 % (ref 36.0–46.0)
Hemoglobin: 14.8 g/dL (ref 12.0–15.0)
Lymphocytes Relative: 11 %
Lymphs Abs: 2.2 10*3/uL (ref 0.7–4.0)
MCH: 29.4 pg (ref 26.0–34.0)
MCHC: 35.4 g/dL (ref 30.0–36.0)
MCV: 83.1 fL (ref 78.0–100.0)
MONO ABS: 0.8 10*3/uL (ref 0.1–1.0)
MONOS PCT: 4 %
NEUTROS PCT: 84 %
Neutro Abs: 16.5 10*3/uL — ABNORMAL HIGH (ref 1.7–7.7)
Platelets: 366 10*3/uL (ref 150–400)
RBC: 5.03 MIL/uL (ref 3.87–5.11)
RDW: 12.9 % (ref 11.5–15.5)
WBC: 19.6 10*3/uL — ABNORMAL HIGH (ref 4.0–10.5)

## 2017-01-01 LAB — COMPREHENSIVE METABOLIC PANEL
ALBUMIN: 4.5 g/dL (ref 3.5–5.0)
ALK PHOS: 67 U/L (ref 38–126)
ALT: 13 U/L — AB (ref 14–54)
AST: 22 U/L (ref 15–41)
Anion gap: 12 (ref 5–15)
BILIRUBIN TOTAL: 0.8 mg/dL (ref 0.3–1.2)
BUN: 9 mg/dL (ref 6–20)
CO2: 22 mmol/L (ref 22–32)
CREATININE: 0.79 mg/dL (ref 0.44–1.00)
Calcium: 9.5 mg/dL (ref 8.9–10.3)
Chloride: 99 mmol/L — ABNORMAL LOW (ref 101–111)
GFR calc Af Amer: >60 mL/min (ref >60)
GLUCOSE: 352 mg/dL — AB (ref 65–99)
Potassium: 4.5 mmol/L (ref 3.5–5.1)
Sodium: 133 mmol/L — ABNORMAL LOW (ref 135–145)
TOTAL PROTEIN: 8.6 g/dL — AB (ref 6.5–8.1)

## 2017-01-01 LAB — I-STAT BETA HCG BLOOD, ED (MC, WL, AP ONLY)

## 2017-01-01 MED ORDER — CHLORDIAZEPOXIDE HCL 25 MG PO CAPS: ORAL_CAPSULE | ORAL | 0 refills | Status: DC

## 2017-01-01 MED ORDER — LORAZEPAM 2 MG/ML IJ SOLN
1.0000 mg | Freq: Once | INTRAMUSCULAR | Status: AC
  Administered 2017-01-01: 1 mg via INTRAVENOUS
  Filled 2017-01-01: qty 1

## 2017-01-01 MED ORDER — SODIUM CHLORIDE 0.9 % IV SOLN
INTRAVENOUS | Status: DC
  Administered 2017-01-01: 19:00:00 via INTRAVENOUS
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
## 2017-01-01 NOTE — ED Notes (Signed)
Bed: [id number] Expected date:  Expected time:  Means of arrival:  Comments: EMS- Seizure, n/v
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
## 2017-01-01 NOTE — ED Provider Notes (Signed)
[facility]-EMERGENCY DEPT Provider Note   CSN: [id number] Arrival date & time: 01/01/17  1801     History   Chief Complaint Chief Complaint  Patient presents with  . Seizures    HPI Kathryn Gilbert is a 26 y.o. female.  26 year old female presents after having a witnessed seizure prior to arrival. Patient chronically been on Xanax and start to wean herself several days ago. Denies any prior history of seizures. No recent fever, neck pain, severe headaches. No bowel or bladder loss. No tongue injury. According to bystanders, patient sees her approximately 2 minutes. Did have emesis afterwards and was treated with Zofran by EMS.her CBG was 384. She feels back to her baseline at this time. Denies any alcohol or other illicit drug use. Xanax is not prescribed for her.      Past Medical History:  Diagnosis Date  . Diabetes mellitus without complication (HCC)   . Gestational diabetes   . Herpes simplex virus type 1 (HSV-1) dermatitis   . Hx of varicella   . Kidney stones    age 86    There are no active problems to display for this patient.   History reviewed. No pertinent surgical history.  OB History    Gravida Para Term Preterm AB Living   1 1 1     1    SAB TAB Ectopic Multiple Live Births           1       Home Medications    Prior to Admission medications   Medication Sig Start Date End Date Taking? Authorizing Provider  ALPRAZolam Prudy Feeler) 1 MG tablet Take 1 mg by mouth 2 (two) times daily as needed for anxiety.   Yes [provider]  azithromycin (ZITHROMAX) 250 MG tablet Take 2 tablets by mouth daily with breakfast. 12/28/16  Yes [provider]  busPIRone (BUSPAR) 10 MG tablet Take 10 mg by mouth 2 (two) times daily. 10/26/16  Yes [provider]  FLUoxetine (PROZAC) 20 MG tablet Take 20 mg by mouth every morning. 10/26/16  Yes [provider]  insulin aspart (NOVOLOG) 100 UNIT/ML injection Inject 5-10 Units into the skin 3 (three)  times daily before meals. Per sliding scale   Yes [provider]  LEVEMIR 100 UNIT/ML injection Inject 25 Units into the skin 2 (two) times daily. 11/08/16  Yes [provider]  Multiple Vitamin (MULTIVITAMIN) tablet Take 2 tablets by mouth daily.    Yes [provider]  PROAIR HFA 108 (90 Base) MCG/ACT inhaler Take 2 puffs by mouth every 4 (four) hours as needed for wheezing or shortness of breath. 12/28/16  Yes [provider]  Probiotic Product (PROBIOTIC PO) Take 1 tablet by mouth daily.   Yes [provider]  acetaminophen (TYLENOL) 325 MG tablet Take 2 tablets (650 mg total) by mouth every 6 (six) hours as needed for mild pain or moderate pain. Patient not taking: Reported on 01/01/2017 12/18/16   Everlene Farrier, PA-C    Family History Family History  Problem Relation Age of Onset  . Hypertension Mother   . Cancer Mother        skin  . Hypertension Father     Social History Social History  Substance Use Topics  . Smoking status: Current Every Day Smoker    Types: Cigarettes    Last attempt to quit: 01/23/2012  . Smokeless tobacco: Never Used  . Alcohol use Yes     Allergies   Patient has no  known allergies.   Review of Systems Review of Systems  All other systems reviewed and are negative.    Physical Exam Updated Vital Signs BP 124/79 (BP Location: Right Arm)   Pulse 84   Temp 98.1 F (36.7 C) (Oral)   Resp 10   Ht 1.613 m (5' 3.5")   Wt 58.1 kg (128 lb)   LMP 11/19/2016   SpO2 96%   BMI 22.32 kg/m   Physical Exam  Constitutional: She is oriented to person, place, and time. She appears well-developed and well-nourished.  Non-toxic appearance. No distress.  HENT:  Head: Normocephalic and atraumatic.  Eyes: Pupils are equal, round, and reactive to light. Conjunctivae, EOM and lids are normal.  Neck: Normal range of motion. Neck supple. No tracheal deviation present. No thyroid mass present.  Cardiovascular: Normal  rate, regular rhythm and normal heart sounds.  Exam reveals no gallop.   No murmur heard. Pulmonary/Chest: Effort normal and breath sounds normal. No stridor. No respiratory distress. She has no decreased breath sounds. She has no wheezes. She has no rhonchi. She has no rales.  Abdominal: Soft. Normal appearance and bowel sounds are normal. She exhibits no distension. There is no tenderness. There is no rebound and no CVA tenderness.  Musculoskeletal: Normal range of motion. She exhibits no edema or tenderness.  Neurological: She is alert and oriented to person, place, and time. She has normal strength. No cranial nerve deficit or sensory deficit. GCS eye subscore is 4. GCS verbal subscore is 5. GCS motor subscore is 6.  Skin: Skin is warm and dry. No abrasion and no rash noted.  Psychiatric: She has a normal mood and affect. Her speech is normal and behavior is normal.  Nursing note and vitals reviewed.    ED Treatments / Results  Labs (all labs ordered are listed, but only abnormal results are displayed) Labs Reviewed  CBC WITH DIFFERENTIAL/PLATELET  COMPREHENSIVE METABOLIC PANEL  I-STAT BETA HCG BLOOD, ED ([facility], [facility], [facility] ONLY)    EKG  EKG Interpretation None       Radiology No results found.  Procedures Procedures (including critical care time)  Medications Ordered in ED Medications  0.9 %  sodium chloride infusion (not administered)  LORazepam (ATIVAN) injection 1 mg (not administered)     Initial Impression / Assessment and Plan / ED Course  I have reviewed the triage vital signs and the nursing notes.  Pertinent labs & imaging results that were available during my care of the patient were reviewed by me and considered in my medical decision making (see chart for details).     Patient given Ativan here and feels better. Head CT negative. Suspectbenzodiazepine withdrawal seizure.will place on benzo taper and patient started to not drive a car until seen by  neurology  Final Clinical Impressions(s) / ED Diagnoses   Final diagnoses:  None    New Prescriptions New Prescriptions   No medications on file     Lorre NickAllen, Glendell Schlottman, MD 01/01/17 2054

## 2017-02-15 ENCOUNTER — Ambulatory Visit: Payer: Self-pay | Admitting: Neurology

## 2017-02-15 ENCOUNTER — Telehealth: Payer: Self-pay

## 2017-02-15 NOTE — Telephone Encounter (Signed)
No showed her NP appointment.

## 2017-02-20 ENCOUNTER — Encounter: Payer: Self-pay | Admitting: Neurology

## 2017-10-29 ENCOUNTER — Emergency Department (HOSPITAL_COMMUNITY)
Admission: EM | Admit: 2017-10-29 | Discharge: 2017-10-30 | Disposition: A | Discharge status: Home / Self Care | Payer: Medicaid Other | Attending: Emergency Medicine | Admitting: Emergency Medicine

## 2017-10-29 DIAGNOSIS — F17210 Nicotine dependence, cigarettes, uncomplicated: Secondary | ICD-10-CM | POA: Diagnosis not present

## 2017-10-29 DIAGNOSIS — Z79899 Other long term (current) drug therapy: Secondary | ICD-10-CM | POA: Insufficient documentation

## 2017-10-29 DIAGNOSIS — F329 Major depressive disorder, single episode, unspecified: Secondary | ICD-10-CM | POA: Diagnosis present

## 2017-10-29 DIAGNOSIS — R739 Hyperglycemia, unspecified: Secondary | ICD-10-CM

## 2017-10-29 DIAGNOSIS — F332 Major depressive disorder, recurrent severe without psychotic features: Secondary | ICD-10-CM | POA: Insufficient documentation

## 2017-10-29 DIAGNOSIS — F1320 Sedative, hypnotic or anxiolytic dependence, uncomplicated: Secondary | ICD-10-CM | POA: Diagnosis not present

## 2017-10-29 DIAGNOSIS — E119 Type 2 diabetes mellitus without complications: Secondary | ICD-10-CM | POA: Diagnosis not present

## 2017-10-29 LAB — CBG MONITORING, ED: GLUCOSE-CAPILLARY: 480 mg/dL — AB (ref 65–99)

## 2017-10-29 NOTE — ED Notes (Signed)
Bed: WTR9 Expected date:  Expected time:  Means of arrival:  Comments: 

## 2017-10-29 NOTE — ED Triage Notes (Signed)
Pt complains of being hyperglycemia and anxiety, she states that her dad died yesterday and she' been going through a depression before that. Pt denies SI because she has a little girl and she would never intentionally take her own life

## 2017-10-30 DIAGNOSIS — F1320 Sedative, hypnotic or anxiolytic dependence, uncomplicated: Secondary | ICD-10-CM

## 2017-10-30 LAB — I-STAT CHEM 8, ED
BUN: 5 mg/dL — ABNORMAL LOW (ref 6–20)
CREATININE: 0.5 mg/dL (ref 0.44–1.00)
Calcium, Ion: 1.17 mmol/L (ref 1.15–1.40)
Chloride: 94 mmol/L — ABNORMAL LOW (ref 101–111)
Glucose, Bld: 401 mg/dL — ABNORMAL HIGH (ref 65–99)
HEMATOCRIT: 38 % (ref 36.0–46.0)
HEMOGLOBIN: 12.9 g/dL (ref 12.0–15.0)
POTASSIUM: 3.8 mmol/L (ref 3.5–5.1)
SODIUM: 130 mmol/L — AB (ref 135–145)
TCO2: 23 mmol/L (ref 22–32)

## 2017-10-30 LAB — RAPID URINE DRUG SCREEN, HOSP PERFORMED
Amphetamines: NOT DETECTED
Barbiturates: NOT DETECTED
Benzodiazepines: POSITIVE — AB
COCAINE: NOT DETECTED
OPIATES: NOT DETECTED
Tetrahydrocannabinol: POSITIVE — AB

## 2017-10-30 LAB — URINALYSIS, ROUTINE W REFLEX MICROSCOPIC
BILIRUBIN URINE: NEGATIVE
Bacteria, UA: NONE SEEN
Glucose, UA: >=500 mg/dL — AB
Hgb urine dipstick: NEGATIVE
KETONES UR: 20 mg/dL — AB
LEUKOCYTES UA: NEGATIVE
Nitrite: NEGATIVE
PROTEIN: NEGATIVE mg/dL
Specific Gravity, Urine: 1.036 — ABNORMAL HIGH (ref 1.005–1.030)
pH: 6 (ref 5.0–8.0)

## 2017-10-30 LAB — CBC WITH DIFFERENTIAL/PLATELET
BASOS ABS: 0 10*3/uL (ref 0.0–0.1)
BASOS PCT: 1 %
Eosinophils Absolute: 0.4 10*3/uL (ref 0.0–0.7)
Eosinophils Relative: 4 %
HEMATOCRIT: 35.2 % — AB (ref 36.0–46.0)
Hemoglobin: 12.3 g/dL (ref 12.0–15.0)
Lymphocytes Relative: 47 %
Lymphs Abs: 4.1 10*3/uL — ABNORMAL HIGH (ref 0.7–4.0)
MCH: 30.1 pg (ref 26.0–34.0)
MCHC: 34.9 g/dL (ref 30.0–36.0)
MCV: 86.1 fL (ref 78.0–100.0)
MONO ABS: 0.5 10*3/uL (ref 0.1–1.0)
Monocytes Relative: 6 %
NEUTROS ABS: 3.6 10*3/uL (ref 1.7–7.7)
Neutrophils Relative %: 42 %
Platelets: 312 10*3/uL (ref 150–400)
RBC: 4.09 MIL/uL (ref 3.87–5.11)
RDW: 12.4 % (ref 11.5–15.5)
WBC: 8.5 10*3/uL (ref 4.0–10.5)

## 2017-10-30 LAB — CBG MONITORING, ED
GLUCOSE-CAPILLARY: 336 mg/dL — AB (ref 65–99)
Glucose-Capillary: 228 mg/dL — ABNORMAL HIGH (ref 65–99)

## 2017-10-30 LAB — PREGNANCY, URINE: Preg Test, Ur: NEGATIVE

## 2017-10-30 MED ORDER — INSULIN ASPART 100 UNIT/ML ~~LOC~~ SOLN
10.0000 [IU] | Freq: Once | SUBCUTANEOUS | Status: AC
  Administered 2017-10-30: 10 [IU] via SUBCUTANEOUS
  Filled 2017-10-30: qty 1

## 2017-10-30 MED ORDER — SODIUM CHLORIDE 0.9 % IV BOLUS
1000.0000 mL | Freq: Once | INTRAVENOUS | Status: AC
  Administered 2017-10-30: 1000 mL via INTRAVENOUS

## 2017-10-30 MED ORDER — ALPRAZOLAM 0.25 MG PO TABS: 0.2500 mg | ORAL_TABLET | Freq: Every day | ORAL | Status: DC

## 2017-10-30 MED ORDER — GABAPENTIN 300 MG PO CAPS: 300.0000 mg | ORAL_CAPSULE | Freq: Three times a day (TID) | ORAL | 0 refills | Status: DC

## 2017-10-30 NOTE — Discharge Instructions (Signed)
For your behavioral health needs, you are advised to follow up with [facility].  Contact them at your earliest opportunity to ask about scheduling an intake appointment: ° °     [facility] °     [address] °     [place], [place] [address] °     [phone] °

## 2017-10-30 NOTE — BH Assessment (Signed)
[facility] Assessment Progress Note  Per Jacqueline Norman, DO, this pt does not require psychiatric hospitalization at this time.  Pt is to be discharged from [facility] with recommendation to follow up with [facility].  This has been included in pt's discharge instructions.  Pt would also benefit from seeing Peer Support Specialists; they will be asked to speak to pt.  Pt's nurse has been notified.  Sheresa Cullop, MA Triage Specialist [phone]     

## 2017-10-30 NOTE — ED Provider Notes (Signed)
[facility]-EMERGENCY DEPT Provider Note   CSN: [id number] Arrival date & time: 10/29/17  2309     History   Chief Complaint Chief Complaint  Patient presents with  . Hyperglycemia  . Anxiety    HPI Kathryn Gilbert is a 27 y.o. female.  HPI  This is a 27 year old female with a history of diabetes who presents with hyperglycemia, anxiety, depression.  Patient reports multiple stressors in her life recently.  She had a miscarriage 2 months ago resulting in a D&C.  Her father passed away yesterday.  She reports increasing feelings of "darkness."  She denies any suicidality.  She states "I could never do that to myself or my daughter."  She is currently taking Paxil but does not have a counselor or psychiatrist.  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  She has a history of Xanax withdrawal seizures but is interested in "getting off of it."  She is requesting detox.  She denies any alcohol or drug abuse.  Patient reports that recently she has had a hard time keeping her blood sugars under control.  She reports compliance with her insulin.  She denies any recent illnesses or fevers.  Past Medical History:  Diagnosis Date  . Diabetes mellitus without complication (HCC)   . Gestational diabetes   . Herpes simplex virus type 1 (HSV-1) dermatitis   . Hx of varicella   . Kidney stones    age 26    There are no active problems to display for this patient.   No past surgical history on file.   OB History    Gravida  1   Para  1   Term  1   Preterm      AB      Living  1     SAB      TAB      Ectopic      Multiple      Live Births  1            Home Medications    Prior to Admission medications   Medication Sig Start Date End Date Taking? Authorizing Provider  ALPRAZolam Prudy Feeler) 1 MG tablet Take 1 mg by mouth 2 (two) times daily as needed for anxiety.   Yes [provider]    FLUoxetine (PROZAC) 20 MG tablet Take 40 mg by mouth every morning.  10/26/16  Yes [provider]  insulin aspart (NOVOLOG) 100 UNIT/ML injection Inject 5-10 Units into the skin 3 (three) times daily before meals. Per sliding scale   Yes [provider]  LEVEMIR 100 UNIT/ML injection Inject 20-50 Units into the skin 2 (two) times daily. 20 in AM and 50 PM 11/08/16  Yes [provider]  traZODone (DESYREL) 50 MG tablet Take 50 mg by mouth at bedtime as needed for sleep.   Yes [provider]  acetaminophen (TYLENOL) 325 MG tablet Take 2 tablets (650 mg total) by mouth every 6 (six) hours as needed for mild pain or moderate pain. Patient not taking: Reported on 01/01/2017 12/18/16   Everlene Farrier, PA-C  chlordiazePOXIDE (LIBRIUM) 25 MG capsule 50mg  PO TID x 1D, then 25-50mg  PO BID X 1D, then 25-50mg  PO QD X 1D Patient not taking: Reported on 10/29/2017 01/01/17   Lorre Nick, MD    Family History Family History  Problem Relation Age of Onset  . Hypertension Mother   . Cancer Mother  skin  . Hypertension Father     Social History Social History   Tobacco Use  . Smoking status: Current Every Day Smoker    Types: Cigarettes    Last attempt to quit: 01/23/2012    Years since quitting: 5.7  . Smokeless tobacco: Never Used  Substance Use Topics  . Alcohol use: Yes  . Drug use: No     Allergies   Patient has no known allergies.   Review of Systems Review of Systems  Constitutional: Negative for fever.  Respiratory: Negative for shortness of breath.   Cardiovascular: Negative for chest pain.  Gastrointestinal: Negative for abdominal pain, nausea and vomiting.  Genitourinary: Negative for dysuria.  Psychiatric/Behavioral: Positive for dysphoric mood. Negative for self-injury and suicidal ideas.  All other systems reviewed and are negative.    Physical Exam Updated Vital Signs BP (!) 93/56   Pulse 66   Temp 98.2 F (36.8 C) (Oral)    Resp 16   Ht 5\' 3"  (1.6 m)   Wt 61.7 kg (136 lb)   LMP 10/29/2017   SpO2 99%   BMI 24.09 kg/m   Physical Exam  Constitutional: She is oriented to person, place, and time. She appears well-developed and well-nourished.  Flat but cooperative  HENT:  Head: Normocephalic and atraumatic.  Eyes: Pupils are equal, round, and reactive to light.  Neck: Neck supple.  Cardiovascular: Normal rate, regular rhythm and normal heart sounds.  Pulmonary/Chest: Effort normal and breath sounds normal. No respiratory distress. She has no wheezes.  Abdominal: Soft. Bowel sounds are normal. There is no tenderness.  Musculoskeletal: She exhibits no edema.  Neurological: She is alert and oriented to person, place, and time.  Skin: Skin is warm and dry.  Psychiatric:  Generally cooperative, appears sad  Nursing note and vitals reviewed.    ED Treatments / Results  Labs (all labs ordered are listed, but only abnormal results are displayed) Labs Reviewed  CBC WITH DIFFERENTIAL/PLATELET - Abnormal; Notable for the following components:      Result Value   HCT 35.2 (*)    Lymphs Abs 4.1 (*)    All other components within normal limits  RAPID URINE DRUG SCREEN, HOSP PERFORMED - Abnormal; Notable for the following components:   Benzodiazepines POSITIVE (*)    Tetrahydrocannabinol POSITIVE (*)    All other components within normal limits  URINALYSIS, ROUTINE W REFLEX MICROSCOPIC - Abnormal; Notable for the following components:   Specific Gravity, Urine 1.036 (*)    Glucose, UA >=500 (*)    Ketones, ur 20 (*)    All other components within normal limits  CBG MONITORING, ED - Abnormal; Notable for the following components:   Glucose-Capillary 480 (*)    All other components within normal limits  I-STAT CHEM 8, ED - Abnormal; Notable for the following components:   Sodium 130 (*)    Chloride 94 (*)    BUN 5 (*)    Glucose, Bld 401 (*)    All other components within normal limits  CBG MONITORING,  ED - Abnormal; Notable for the following components:   Glucose-Capillary 336 (*)    All other components within normal limits  CBG MONITORING, ED - Abnormal; Notable for the following components:   Glucose-Capillary 228 (*)    All other components within normal limits  PREGNANCY, URINE    EKG None  Radiology No results found.  Procedures Procedures (including critical care time)  Medications Ordered in ED Medications  sodium chloride 0.9 %  bolus 1,000 mL (0 mLs Intravenous Stopped 10/30/17 0300)  insulin aspart (novoLOG) injection 10 Units (10 Units Subcutaneous Given 10/30/17 [id number])     Initial Impression / Assessment and Plan / ED Course  I have reviewed the triage vital signs and the nursing notes.  Pertinent labs & imaging results that were available during my care of the patient were reviewed by me and considered in my medical decision making (see chart for details).     She presents with hyperglycemia.  Also wishing to detox from Xanax.  She reports multiple recent stressors and increasing depression although no SI or HI.  She is hyperglycemic without an anion gap.  She was given fluids and insulin.  No evidence of infection.  She is not pregnant.  Blood sugars decreased with these interventions.  No evidence of DKA.  Regarding her benzodiazepine dependence.  TTS consulted given history of withdrawals and her dependence.  They recommend a peers support.  The consult has been placed.  Final Clinical Impressions(s) / ED Diagnoses   Final diagnoses:  Hyperglycemia  Benzodiazepine dependence [facility](HCC)    ED Discharge Orders    None       Shon BatonHorton, September Mormile F, MD 10/30/17 [phone]

## 2017-10-30 NOTE — Progress Notes (Signed)
Inpatient Diabetes Program Recommendations  AACE/ADA: New Consensus Statement on Inpatient Glycemic Control (2015)  Target Ranges:  Prepandial:   less than 140 mg/dL      Peak postprandial:   less than 180 mg/dL (1-2 hours)      Critically ill patients:  140 - 180 mg/dL   Lab Results  Component Value Date   GLUCAP 228 (H) 10/30/2017    Review of Glycemic Control  Diabetes history:  Outpatient Diabetes medications: Levemir 20 units every am, 50 units every pm, Novolog 5-10 units TID per sliding scale Current orders for Inpatient glycemic control: none   Inpatient Diabetes Program Recommendations:    Noting that patient's blood sugars are greater than 180 mg/dl, recommend starting Levemir 20 units BID, Novolog SENSITIVE correction scale every 4 hours if unable to eat, TID & HS if eating.  May need Novolog 3 or 4 units TID if eating at least 50% of meals. May need to titrate dosages as needed.   Smith MinceKendra Sravya Grissom RN BSN CDE Diabetes Coordinator Pager: [phone]  8am-5pm

## 2017-10-30 NOTE — [facility] Suicide Risk Assessment (Signed)
[facility] Discharge Suicide Risk Assessment   Principal Problem: Severe benzodiazepine use disorder [facility]) Discharge Diagnoses:  Patient Active Problem List   Diagnosis Date Noted  . Severe benzodiazepine use disorder [facility]) [F13.20] 10/30/2017    Total Time spent with patient: 30 minutes  Ms. Kathryn Gilbert reports several stressors including a miscarriage 1 month ago and her father passing away on Sunday. His health was declining for sometime in the setting of dementia. She reports taking Prozac 40 mg daily. It was last increased a week ago by her PCP. She has a history of anxiety and follows with Dr. Evelene Croon. She reports abusing Xanax. She is prescribed 0.25 mg daily but has been taking 3-4 mg daily. She has been obtaining her medication from the street. She reports having one seizure in August from benzodiazepine withdrawal. She denies problems with sleep. She reports poor appetite. She denies SI, HI or AVH. She reports a history of cutting at 27 y/o. She denies access to weapons.   Musculoskeletal: Strength & Muscle Tone: within normal limits Gait & Station: UTA since patient was lying in bed. Patient leans: N/A  Psychiatric Specialty Exam: Review of Systems  Psychiatric/Behavioral: Positive for substance abuse. Negative for hallucinations and suicidal ideas. The patient does not have insomnia.   All other systems reviewed and are negative.   Blood pressure 112/71, pulse 66, temperature 98.2 F (36.8 C), temperature source Oral, resp. rate 16, height 5\' 3"  (1.6 m), weight 61.7 kg (136 lb), last menstrual period 10/29/2017, SpO2 97 %, unknown if currently breastfeeding.Body mass index is 24.09 kg/m.  General Appearance: Fairly Groomed, young, Caucasian female, wearing a hospital gown and lying in bed. NAD.   Eye Contact::  Good  Speech:  Clear and Coherent and Normal Rate  Volume:  Normal  Mood:  Depressed  Affect:  Constricted  Thought Process:  Goal Directed, Linear and Descriptions of  Associations: Intact  Orientation:  Full (Time, Place, and Person)  Thought Content:  Logical  Suicidal Thoughts:  No  Homicidal Thoughts:  No  Memory:  Immediate;   Good Recent;   Good Remote;   Good  Judgement:  Good  Insight:  Good  Psychomotor Activity:  Normal  Concentration:  Good  Recall:  Good  Fund of Knowledge:Good  Language: Good  Akathisia:  No  Handed:  Right  AIMS (if indicated):   N/A  Assets:  Communication Skills Desire for Improvement Housing  Sleep:   Okay  Cognition: WNL  ADL's:  Intact   Mental Status Per Nursing Assessment::   On Admission:   "Patient is very drowsy during assessment. Patient denies any SI, plan or intention.  No past suicide attempts.  Patient also denies any A/V hallucinations."    Demographic Factors:  Caucasian and Unemployed  Loss Factors: Loss of significant relationship  Historical Factors: NA  Risk Reduction Factors:   Responsible for children under 15 years of age, Sense of responsibility to family, Living with another person, especially a relative, Positive social support and Positive therapeutic relationship  Continued Clinical Symptoms:  Alcohol/Substance Abuse/Dependencies Previous Psychiatric Diagnoses and Treatments Medical Diagnoses and Treatments/Surgeries  Cognitive Features That Contribute To Risk:  None    Suicide Risk:  Minimal: No identifiable suicidal ideation.  Patients presenting with no risk factors but with morbid ruminations; may be classified as minimal risk based on the severity of the depressive symptoms  Assessment: Kathryn Gilbert is a 27 y.o. female who was admitted with depression in the setting of multiple stressors.  She reports Xanax abuse. She denies SI, HI or AVH and does not appear to be responding to internal stimuli. She does not warrant inpatient psychiatric hospitalization at this time. She will be provided with resources for substance abuse treatment and be prescribed Gabapentin  for alcohol cravings.   Plan Of Care/Follow-up recommendations:  -Start Gabapentin 300 mg TID for alcohol withdrawal/cravings and anxiety.  -Continue psychotropic medications as prescribed by outpatient provider. -Peer support will provide patient with resources for substance abuse treatment. -Discharge to home.   Cherly BeachJacqueline J Falecia Vannatter, DO 10/30/2017, 12:07 PM
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
## 2017-10-30 NOTE — Patient Outreach (Signed)
ED Peer Support Specialist Patient Intake (Complete at intake & 30-60 Day Follow-up)  Name: Kathryn Gilbert  MRN: [id number]  Age: 27 y.o.   Date of Admission: 10/30/2017  Intake: Initial Comments:      Primary Reason Admitted:This is a 27 year old female with a history of diabetes who presents with hyperglycemia, anxiety, depression. Patient reports multiple stressors in her life recently. She had a miscarriage 2 months ago resulting in a D&C. Her father passed away yesterday. She reports increasing feelings of "darkness." She denies any suicidality. She states "I could never do that to myself or my daughter." She is currently taking Paxil but does not have a counselor or psychiatrist. 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 She has a history of Xanax withdrawal seizures but is interested in "getting off of it." She is requesting detox. She denies any alcohol or drug abuse.      Lab values: Alcohol/ETOH: Positive Positive UDS? Yes Amphetamines: No Barbiturates: No Benzodiazepines: Yes Cocaine: No Opiates: No Cannabinoids: Yes  Demographic information: Gender: Female Ethnicity: White Marital Status: Single Insurance Status: Medicaid(BSBC) Ecologist (Work Neurosurgeon, Physicist, medical, Social research officer, government.: Yes(Food Public librarian ) Lives with: Friend/Rommate Living situation: House/Apartment  Reported Patient History: Patient reported health conditions: Depression, Diabetes Patient aware of HIV and hepatitis status: No  In past year, has patient visited ED for any reason? Yes  Number of ED visits: 2  Reason(s) for visit: other reasons   In past year, has patient been hospitalized for any reason? No  Number of hospitalizations:    Reason(s) for hospitalization:    In past year, has patient been arrested? Yes  Number of arrests: 1  Reason(s) for arrest: DWI prescribed medication outside the bottle  In  past year, has patient been incarcerated? No  Number of incarcerations:    Reason(s) for incarceration:    In past year, has patient received medication-assisted treatment? No  In past year, patient received the following treatments: Residential treatment (non-hospital)  In past year, has patient received any harm reduction services? No  Did this include any of the following?    In past year, has patient received care from a mental health provider for diagnosis other than SUD? No  In past year, is this first time patient has overdosed? No  Number of past overdoses:    In past year, is this first time patient has been hospitalized for an overdose? No  Number of hospitalizations for overdose(s):    Is patient currently receiving treatment for a mental health diagnosis? No  Patient reports experiencing difficulty participating in SUD treatment: No    Most important reason(s) for this difficulty?    Has patient received prior services for treatment? No  In past, patient has received services from following agencies:    Plan of Care:  Suggested follow up at these agencies/treatment centers: Other (comment)  Other information: [facility] met with Pt an talked with her about how she is doing and to see if there are any concerns that Pt wants to address at the time. CPSS mentioned to Pt the services that [facility] provide and what [facility] can assist Pt with finding services with her depression. CPSS was made aware that Pt was seeking a doctor that can help her regulate her medications. CPSS was able to give Pt information about [facility] services.    Aaron Edelman Nehemie Casserly, CPSS  10/30/2017 11:35 AM

## 2018-02-20 DIAGNOSIS — E871 Hypo-osmolality and hyponatremia: Secondary | ICD-10-CM

## 2018-02-20 DIAGNOSIS — E1110 Type 2 diabetes mellitus with ketoacidosis without coma: Secondary | ICD-10-CM | POA: Diagnosis not present

## 2018-02-20 DIAGNOSIS — Z720 Tobacco use: Secondary | ICD-10-CM

## 2018-02-21 DIAGNOSIS — E1110 Type 2 diabetes mellitus with ketoacidosis without coma: Secondary | ICD-10-CM | POA: Diagnosis not present

## 2018-02-21 DIAGNOSIS — Z720 Tobacco use: Secondary | ICD-10-CM | POA: Diagnosis not present

## 2018-02-21 DIAGNOSIS — E871 Hypo-osmolality and hyponatremia: Secondary | ICD-10-CM | POA: Diagnosis not present

## 2018-04-01 DIAGNOSIS — E109 Type 1 diabetes mellitus without complications: Secondary | ICD-10-CM | POA: Diagnosis not present

## 2018-04-01 DIAGNOSIS — Z720 Tobacco use: Secondary | ICD-10-CM

## 2018-04-01 DIAGNOSIS — E871 Hypo-osmolality and hyponatremia: Secondary | ICD-10-CM | POA: Diagnosis not present

## 2018-04-01 DIAGNOSIS — E1110 Type 2 diabetes mellitus with ketoacidosis without coma: Secondary | ICD-10-CM

## 2018-04-01 DIAGNOSIS — R112 Nausea with vomiting, unspecified: Secondary | ICD-10-CM

## 2018-04-02 DIAGNOSIS — R112 Nausea with vomiting, unspecified: Secondary | ICD-10-CM | POA: Diagnosis not present

## 2018-04-02 DIAGNOSIS — E109 Type 1 diabetes mellitus without complications: Secondary | ICD-10-CM | POA: Diagnosis not present

## 2018-04-02 DIAGNOSIS — E1110 Type 2 diabetes mellitus with ketoacidosis without coma: Secondary | ICD-10-CM | POA: Diagnosis not present

## 2018-04-02 DIAGNOSIS — E871 Hypo-osmolality and hyponatremia: Secondary | ICD-10-CM | POA: Diagnosis not present

## 2018-04-03 DIAGNOSIS — E109 Type 1 diabetes mellitus without complications: Secondary | ICD-10-CM | POA: Diagnosis not present

## 2018-04-03 DIAGNOSIS — R112 Nausea with vomiting, unspecified: Secondary | ICD-10-CM | POA: Diagnosis not present

## 2018-04-03 DIAGNOSIS — E1110 Type 2 diabetes mellitus with ketoacidosis without coma: Secondary | ICD-10-CM | POA: Diagnosis not present

## 2018-05-03 IMAGING — CT CT HEAD W/O CM
3 of 4 series · 14 of 47 positions shown, 16 images · non-contrast
Comparison: 07/30/2011

CLINICAL DATA: Seizure

EXAM:
CT HEAD WITHOUT CONTRAST
TECHNIQUE: Contiguous axial images were obtained from the base of the skull
through the vertex without intravenous contrast.

[Series 3: head w/o · axial · non-contrast · 0.45mm/px · z∈[-128,+2]mm · 8 of 34 slices shown, 10 images]
[im 4/34  brain]
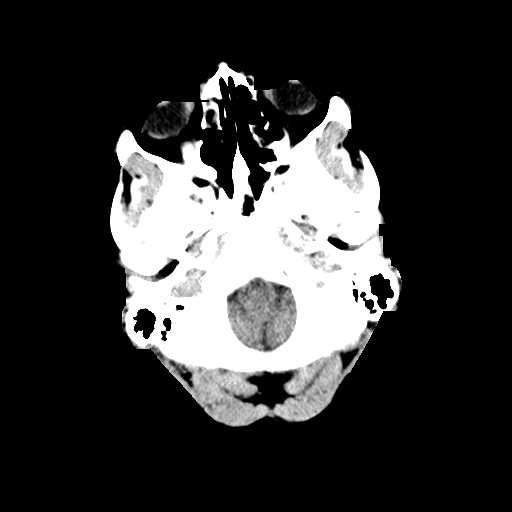
[im 4/34  bone]
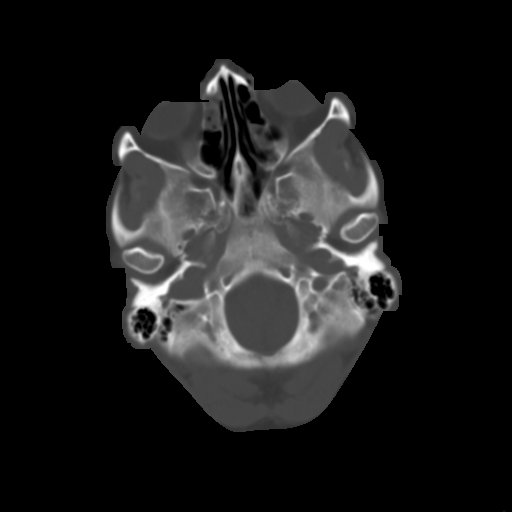
[im 7/34  brain]
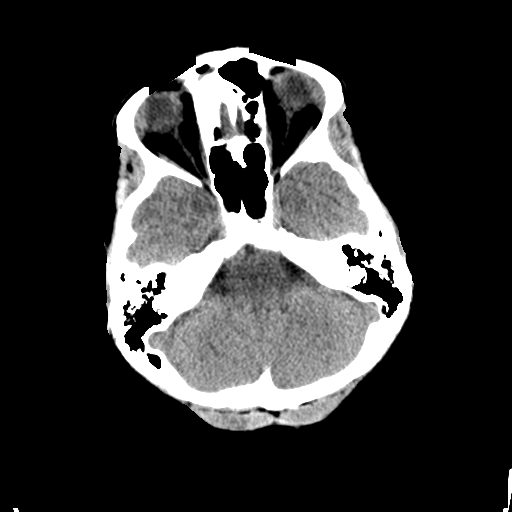
[im 10/34  brain]
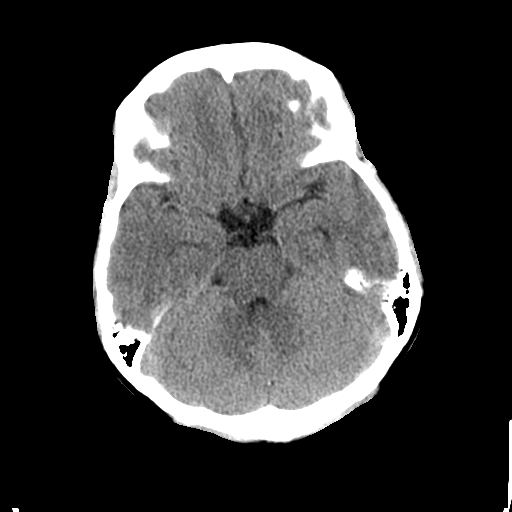
[im 14/34  brain]
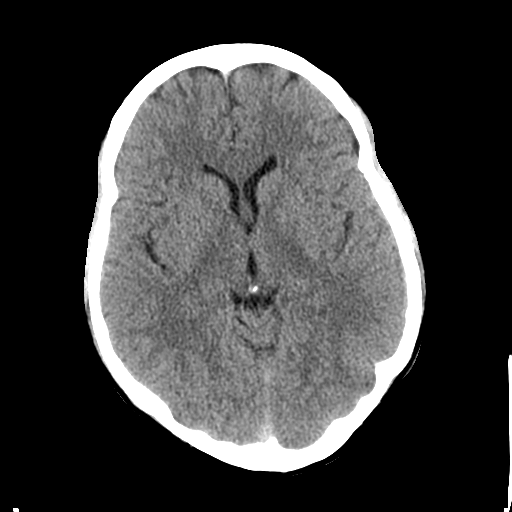
[im 20/34  brain]
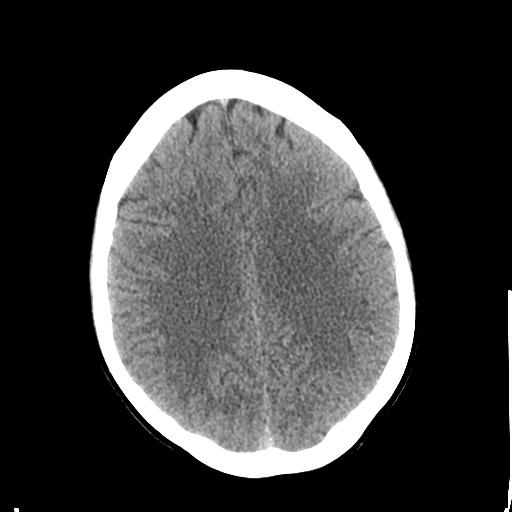
[im 20/34  bone]
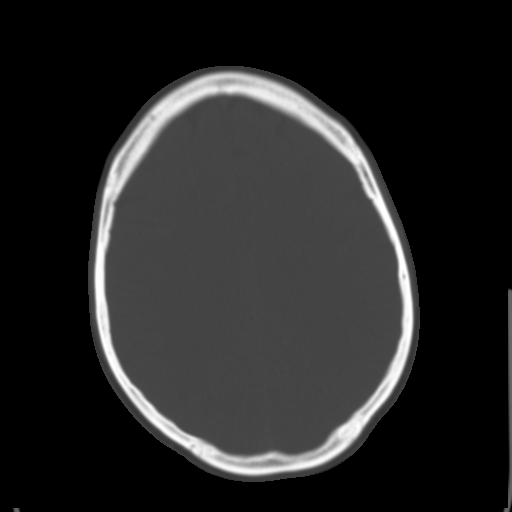
[im 24/34  brain]
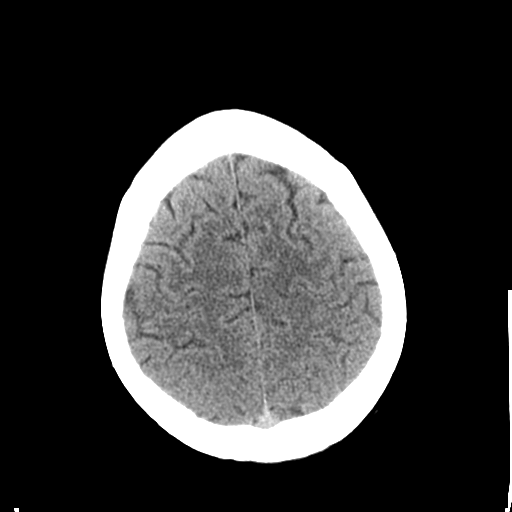
[im 27/34  brain]
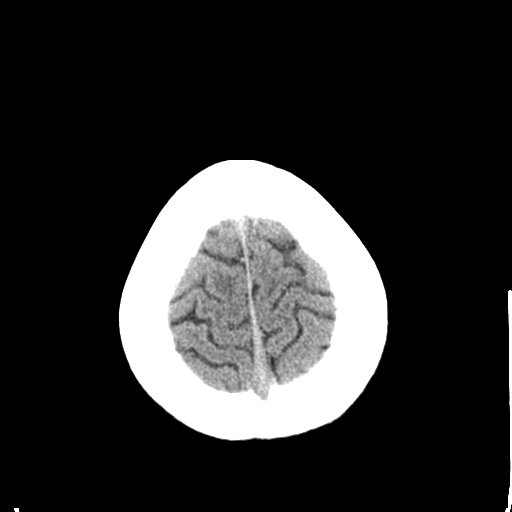
[im 30/34  brain]
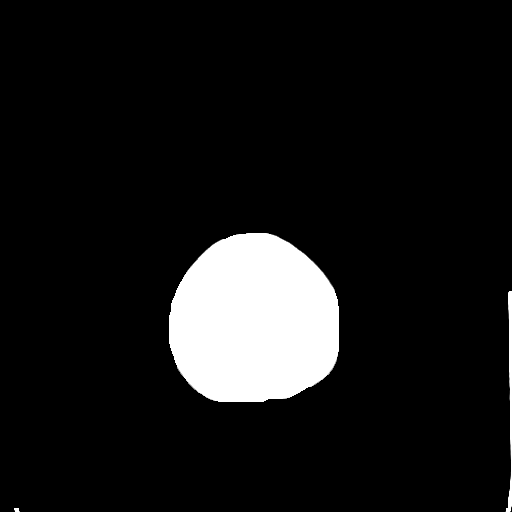

[Series 5: coronal · coronal · 0.32mm/px · 3 of 77 slices shown]
[im 26/77  brain]
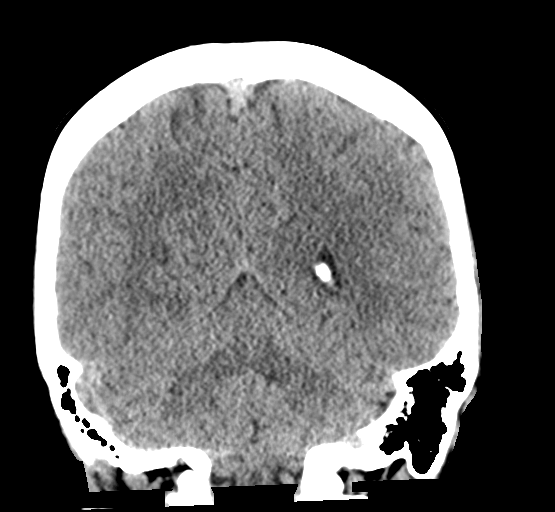
[im 34/77  brain]
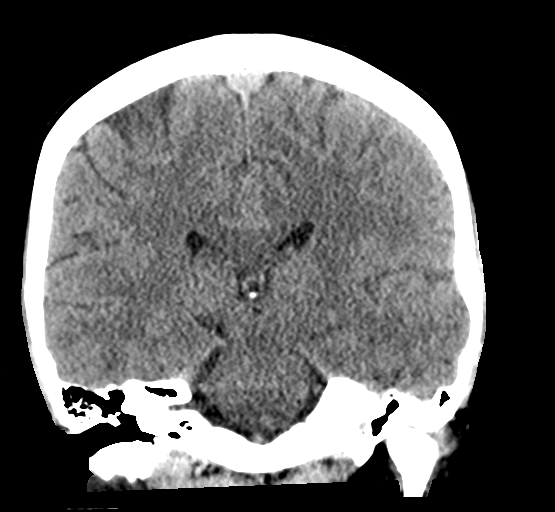
[im 43/77  brain]
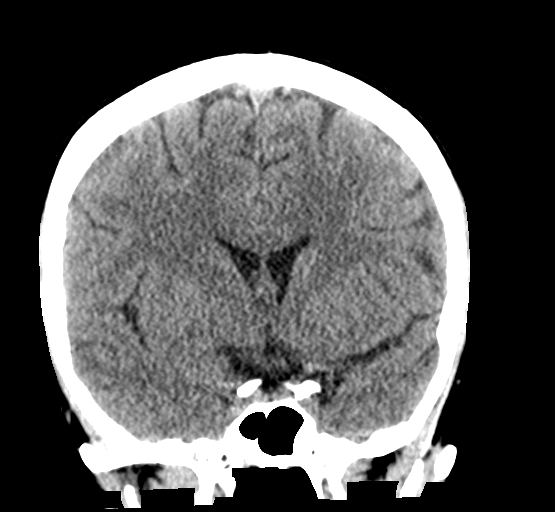

[Series 6: sagittal · sagittal · 0.32mm/px · 3 of 64 slices shown]
[im 22/64  brain]
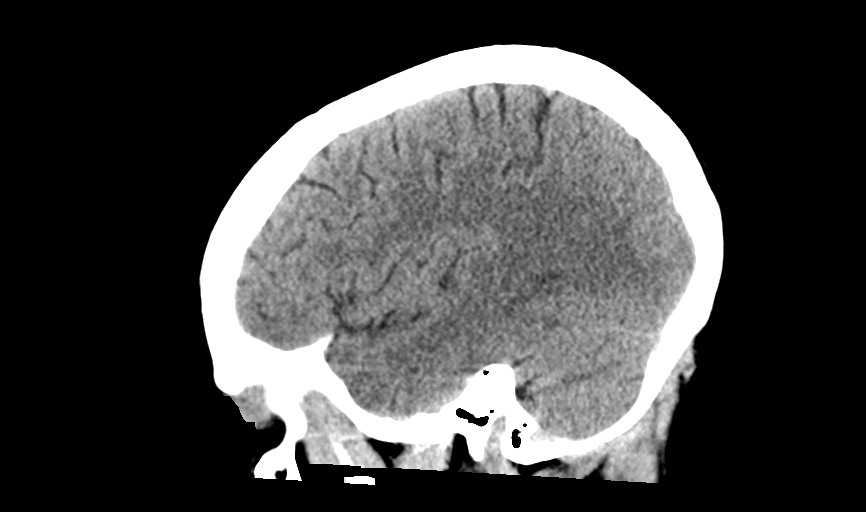
[im 32/64  brain]
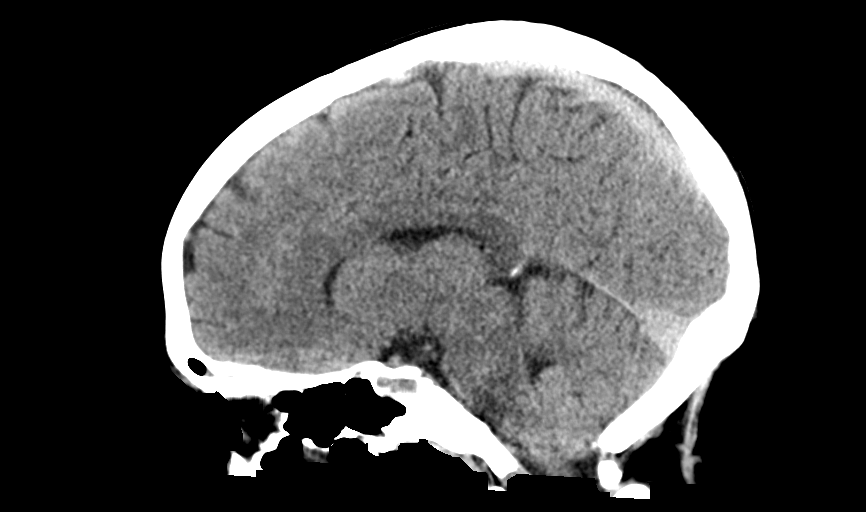
[im 43/64  brain]
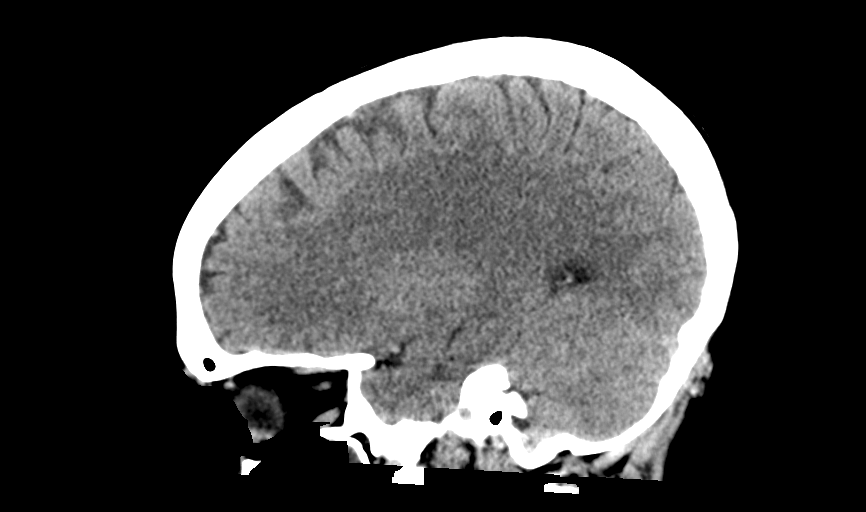

[14 of 47 positions shown; findings below may reference images not displayed]

FINDINGS: Brain: No evidence of acute infarction, hemorrhage, hydrocephalus,
extra-axial collection or mass lesion/mass effect.

Vascular: No hyperdense vessel or unexpected calcification.

Skull: Normal. Negative for fracture or focal lesion.

Sinuses/Orbits: Fluid levels in the maxillary sinus and right
frontal sinus. Mucosal thickening in the ethmoid sinuses with debris
in the sphenoid sinus. No acute orbital abnormality

Other: None
IMPRESSION: 1. No CT evidence for acute intracranial abnormality.
2. Sinusitis

## 2020-02-16 ENCOUNTER — Other Ambulatory Visit: Payer: Self-pay

## 2020-02-16 ENCOUNTER — Inpatient Hospital Stay (HOSPITAL_COMMUNITY)
Admission: AD | Admit: 2020-02-16 | Discharge: 2020-02-17 | Disposition: A | Discharge status: Home / Self Care | Payer: Medicaid Other | Attending: Obstetrics and Gynecology | Admitting: Obstetrics and Gynecology

## 2020-02-16 ENCOUNTER — Encounter (HOSPITAL_COMMUNITY): Payer: Self-pay | Admitting: Obstetrics and Gynecology

## 2020-02-16 DIAGNOSIS — O24013 Pre-existing diabetes mellitus, type 1, in pregnancy, third trimester: Secondary | ICD-10-CM | POA: Diagnosis not present

## 2020-02-16 DIAGNOSIS — R030 Elevated blood-pressure reading, without diagnosis of hypertension: Secondary | ICD-10-CM | POA: Diagnosis not present

## 2020-02-16 DIAGNOSIS — Z794 Long term (current) use of insulin: Secondary | ICD-10-CM | POA: Diagnosis not present

## 2020-02-16 DIAGNOSIS — Z79899 Other long term (current) drug therapy: Secondary | ICD-10-CM | POA: Diagnosis not present

## 2020-02-16 DIAGNOSIS — O09899 Supervision of other high risk pregnancies, unspecified trimester: Secondary | ICD-10-CM

## 2020-02-16 DIAGNOSIS — O26891 Other specified pregnancy related conditions, first trimester: Secondary | ICD-10-CM | POA: Diagnosis present

## 2020-02-16 DIAGNOSIS — O34212 Maternal care for vertical scar from previous cesarean delivery: Secondary | ICD-10-CM | POA: Insufficient documentation

## 2020-02-16 DIAGNOSIS — Z8759 Personal history of other complications of pregnancy, childbirth and the puerperium: Secondary | ICD-10-CM | POA: Insufficient documentation

## 2020-02-16 DIAGNOSIS — R109 Unspecified abdominal pain: Secondary | ICD-10-CM | POA: Diagnosis not present

## 2020-02-16 DIAGNOSIS — O200 Threatened abortion: Secondary | ICD-10-CM

## 2020-02-16 DIAGNOSIS — O09299 Supervision of pregnancy with other poor reproductive or obstetric history, unspecified trimester: Secondary | ICD-10-CM

## 2020-02-16 DIAGNOSIS — O468X1 Other antepartum hemorrhage, first trimester: Secondary | ICD-10-CM | POA: Diagnosis not present

## 2020-02-16 DIAGNOSIS — O418X10 Other specified disorders of amniotic fluid and membranes, first trimester, not applicable or unspecified: Secondary | ICD-10-CM

## 2020-02-16 DIAGNOSIS — Z3A09 9 weeks gestation of pregnancy: Secondary | ICD-10-CM | POA: Insufficient documentation

## 2020-02-16 DIAGNOSIS — O99331 Smoking (tobacco) complicating pregnancy, first trimester: Secondary | ICD-10-CM | POA: Diagnosis not present

## 2020-02-16 DIAGNOSIS — Z3490 Encounter for supervision of normal pregnancy, unspecified, unspecified trimester: Secondary | ICD-10-CM

## 2020-02-16 DIAGNOSIS — R1032 Left lower quadrant pain: Secondary | ICD-10-CM | POA: Diagnosis not present

## 2020-02-16 DIAGNOSIS — F17210 Nicotine dependence, cigarettes, uncomplicated: Secondary | ICD-10-CM | POA: Diagnosis not present

## 2020-02-16 DIAGNOSIS — O34219 Maternal care for unspecified type scar from previous cesarean delivery: Secondary | ICD-10-CM

## 2020-02-16 DIAGNOSIS — O99891 Other specified diseases and conditions complicating pregnancy: Secondary | ICD-10-CM | POA: Diagnosis not present

## 2020-02-16 DIAGNOSIS — O26899 Other specified pregnancy related conditions, unspecified trimester: Secondary | ICD-10-CM

## 2020-02-16 DIAGNOSIS — O161 Unspecified maternal hypertension, first trimester: Secondary | ICD-10-CM

## 2020-02-16 LAB — POCT PREGNANCY, URINE: Preg Test, Ur: POSITIVE — AB

## 2020-02-16 NOTE — MAU Note (Signed)
Pt reports positive preg test 2 weeks ago. Pt reports sharp lower left abd x 2-3 days. Also reports bilateral upper abd pain just below her ribs. Pt had a c-section in March.

## 2020-02-17 ENCOUNTER — Inpatient Hospital Stay (HOSPITAL_COMMUNITY): Payer: Medicaid Other

## 2020-02-17 ENCOUNTER — Encounter (HOSPITAL_COMMUNITY): Payer: Self-pay | Admitting: Obstetrics and Gynecology

## 2020-02-17 DIAGNOSIS — R030 Elevated blood-pressure reading, without diagnosis of hypertension: Secondary | ICD-10-CM

## 2020-02-17 DIAGNOSIS — R109 Unspecified abdominal pain: Secondary | ICD-10-CM

## 2020-02-17 DIAGNOSIS — Z3A09 9 weeks gestation of pregnancy: Secondary | ICD-10-CM

## 2020-02-17 DIAGNOSIS — O99891 Other specified diseases and conditions complicating pregnancy: Secondary | ICD-10-CM

## 2020-02-17 DIAGNOSIS — O468X1 Other antepartum hemorrhage, first trimester: Secondary | ICD-10-CM

## 2020-02-17 DIAGNOSIS — O26891 Other specified pregnancy related conditions, first trimester: Secondary | ICD-10-CM

## 2020-02-17 LAB — WET PREP, GENITAL
Clue Cells Wet Prep HPF POC: NONE SEEN
Sperm: NONE SEEN
Trich, Wet Prep: NONE SEEN
Yeast Wet Prep HPF POC: NONE SEEN

## 2020-02-17 LAB — URINALYSIS, ROUTINE W REFLEX MICROSCOPIC
Bacteria, UA: NONE SEEN
Bilirubin Urine: NEGATIVE
Glucose, UA: >=500 mg/dL — AB
Hgb urine dipstick: NEGATIVE
Ketones, ur: NEGATIVE mg/dL
Leukocytes,Ua: NEGATIVE
Nitrite: NEGATIVE
Protein, ur: NEGATIVE mg/dL
Specific Gravity, Urine: 1.02 (ref 1.005–1.030)
pH: 6 (ref 5.0–8.0)

## 2020-02-17 LAB — CBC
HCT: 37.4 % (ref 36.0–46.0)
Hemoglobin: 12.4 g/dL (ref 12.0–15.0)
MCH: 28.8 pg (ref 26.0–34.0)
MCHC: 33.2 g/dL (ref 30.0–36.0)
MCV: 87 fL (ref 80.0–100.0)
Platelets: 399 10*3/uL (ref 150–400)
RBC: 4.3 MIL/uL (ref 3.87–5.11)
RDW: 13 % (ref 11.5–15.5)
WBC: 12.1 10*3/uL — ABNORMAL HIGH (ref 4.0–10.5)
nRBC: 0 % (ref 0.0–0.2)

## 2020-02-17 LAB — HCG, QUANTITATIVE, PREGNANCY: hCG, Beta Chain, Quant, S: 97893 m[IU]/mL — ABNORMAL HIGH (ref <5)

## 2020-02-17 LAB — HIV ANTIBODY (ROUTINE TESTING W REFLEX): HIV Screen 4th Generation wRfx: NONREACTIVE

## 2020-02-17 NOTE — MAU Provider Note (Addendum)
History     CSN: [id number]  Arrival date and time: 02/16/20 2235   First Provider Initiated Contact with Patient 02/17/20 0025      Chief Complaint  Patient presents with  . Abdominal Pain   Ms. Kathryn Gilbert is a 29 y.o. year old G73P2022 female at [redacted]w[redacted]d weeks gestation who presents to MAU reporting positive home pregnancy test 2 weeks ago.  She reports sharp pain in her lower left quadrant for 2 to 3 days. She states that the left side is worse than the right side; around her C-section incision.  She also reports bilateral she delivered by primary cesarean on March, 19 2021 for failure to progress.  She was induced at 37 weeks and 1 day for preeclampsia and type 1 diabetes.  She receives her care through [facility] her last pregnancy was also complicated with a postpartum readmission for preeclampsia and magnesium sulfate infusion x24 hours on August 20, 2019; discharge August 22, 2019.  Upon discharge she was prescribed Procardia 30 XL and labetalol 400 mg twice daily.  By her next postpartum visit she decreased her labetalol to 200 mg twice daily and felt better with the lower dose.  She was normotensive at her last postpartum visit.  She states she called her doctor to ask for birth control and was told that she would get a progestin only pill prescribed.  She states that the doctor never called in her prescription.  She reports her last menstrual period was December 10, 2019 and she was still breast-feeding at that time.  She reports that she stopped breast-feeding and never resumed her a menstrual cycle; which caused her to take a pregnancy test.  She states she wants to transfer her OB care.   OB History    Gravida  5   Para  2   Term  2   Preterm      AB  2   Living  2     SAB  1   TAB      Ectopic      Multiple      Live Births  2           Past Medical History:  Diagnosis Date  . Diabetes mellitus without complication (HCC)    Type 1  . Herpes simplex virus  type 1 (HSV-1) dermatitis   . Hx of varicella   . Kidney stones    age 71    Past Surgical History:  Procedure Laterality Date  . CESAREAN SECTION      Family History  Problem Relation Age of Onset  . Hypertension Mother   . Cancer Mother        skin  . Hypertension Father     Social History   Tobacco Use  . Smoking status: Current Some Day Smoker    Types: Cigarettes    Last attempt to quit: 01/23/2012    Years since quitting: 8.0  . Smokeless tobacco: Never Used  Substance Use Topics  . Alcohol use: Not Currently  . Drug use: No    Allergies: No Known Allergies  Medications Prior to Admission  Medication Sig Dispense Refill Last Dose  . ALPRAZolam (XANAX) 0.25 MG tablet Take 1 tablet (0.25 mg total) by mouth daily.   02/16/2020 at Unknown time  . insulin aspart (NOVOLOG) 100 UNIT/ML injection Inject 5-10 Units into the skin 3 (three) times daily before meals. Per sliding scale   02/15/2020 at Unknown time  . FLUoxetine (  PROZAC) 20 MG tablet Take 40 mg by mouth every morning.   1   . gabapentin (NEURONTIN) 300 MG capsule Take 1 capsule (300 mg total) by mouth 3 (three) times daily. 45 capsule 0   . LEVEMIR 100 UNIT/ML injection Inject 20-50 Units into the skin 2 (two) times daily. 20 in AM and 50 PM  12 More than a month at Unknown time  . traZODone (DESYREL) 50 MG tablet Take 50 mg by mouth at bedtime as needed for sleep.       Review of Systems  Constitutional: Negative.   HENT: Negative.   Eyes: Negative.   Respiratory: Negative.   Cardiovascular: Negative.   Gastrointestinal: Positive for abdominal pain (upper "just under the ribs" ).  Endocrine: Negative.   Genitourinary: Positive for pelvic pain (lower abdomen; L>R "near cesarean incision").  Musculoskeletal: Negative.   Skin: Negative.   Allergic/Immunologic: Negative.   Neurological: Negative.   Hematological: Negative.   Psychiatric/Behavioral: Negative.    Physical Exam   Patient Vitals for the  past 24 hrs:  BP Temp Pulse Resp SpO2 Height Weight  02/17/20 0040 132/67 -- 79 -- -- -- --  02/16/20 2257 139/74 98.5 F (36.9 C) 75 17 100 % 5\' 4"  (1.626 m) 76.7 kg    Physical Exam Vitals and nursing note reviewed. Exam conducted with a chaperone present.  Constitutional:      Appearance: She is well-developed and normal weight.  HENT:     Head: Normocephalic and atraumatic.  Eyes:     Extraocular Movements: Extraocular movements intact.  Cardiovascular:     Rate and Rhythm: Normal rate.  Abdominal:     General: Abdomen is flat.     Palpations: Abdomen is soft.  Genitourinary:    Cervix: Cervical motion tenderness present.     Uterus: Enlarged and tender (mild).      Adnexa: Right adnexa normal.       Left: Tenderness present.   Skin:    General: Skin is warm and dry.  Neurological:     Mental Status: She is alert and oriented to person, place, and time.  Psychiatric:        Mood and Affect: Mood normal.        Behavior: Behavior normal.     MAU Course  Procedures  MDM CCUA UPT CBC ABO/Rh -- not drawn; known O POSITIVE HCG Wet Prep GC/CT -- pending HIV -- pending OB < 14 wks with TV  Results for orders placed or performed during the hospital encounter of 02/16/20 (from the past 24 hour(s))  Urinalysis, Routine w reflex microscopic Urine, Clean Catch     Status: Abnormal   Collection Time: 02/16/20 11:36 PM  Result Value Ref Range   Color, Urine YELLOW YELLOW   APPearance CLEAR CLEAR   Specific Gravity, Urine 1.020 1.005 - 1.030   pH 6.0 5.0 - 8.0   Glucose, UA >=500 (A) NEGATIVE mg/dL   Hgb urine dipstick NEGATIVE NEGATIVE   Bilirubin Urine NEGATIVE NEGATIVE   Ketones, ur NEGATIVE NEGATIVE mg/dL   Protein, ur NEGATIVE NEGATIVE mg/dL   Nitrite NEGATIVE NEGATIVE   Leukocytes,Ua NEGATIVE NEGATIVE   RBC / HPF 0-5 0 - 5 RBC/hpf   WBC, UA 0-5 0 - 5 WBC/hpf   Bacteria, UA NONE SEEN NONE SEEN   Squamous Epithelial / LPF 0-5 0 - 5   Mucus PRESENT    Pregnancy, urine POC     Status: Abnormal   Collection Time: 02/16/20  11:36 PM  Result Value Ref Range   Preg Test, Ur POSITIVE (A) NEGATIVE  Wet prep, genital     Status: Abnormal   Collection Time: 02/17/20 12:35 AM   Specimen: Vaginal  Result Value Ref Range   Yeast Wet Prep HPF POC NONE SEEN NONE SEEN   Trich, Wet Prep NONE SEEN NONE SEEN   Clue Cells Wet Prep HPF POC NONE SEEN NONE SEEN   WBC, Wet Prep HPF POC MANY (A) NONE SEEN   Sperm NONE SEEN   CBC     Status: Abnormal   Collection Time: 02/17/20 12:38 AM  Result Value Ref Range   WBC 12.1 (H) 4.0 - 10.5 K/uL   RBC 4.30 3.87 - 5.11 MIL/uL   Hemoglobin 12.4 12.0 - 15.0 g/dL   HCT 44.0 36 - 46 %   MCV 87.0 80.0 - 100.0 fL   MCH 28.8 26.0 - 34.0 pg   MCHC 33.2 30.0 - 36.0 g/dL   RDW 34.7 42.5 - 95.6 %   Platelets 399 150 - 400 K/uL   nRBC 0.0 0.0 - 0.2 %    US OB Comp Less 14 Wks  Result Date: 02/17/2020 CLINICAL DATA:  Left lower quadrant pain for 2-3 days, quantitative hCG in process, G5P2 SAB1, Caesarean section March 2021 EXAM: OBSTETRIC <14 WK ULTRASOUND TECHNIQUE: Transabdominal ultrasound was performed for evaluation of the gestation as well as the maternal uterus and adnexal regions. COMPARISON:  Obstetrical ultrasound from prior gestation 01/05/2019, CT abdomen pelvis 05/07/2019 FINDINGS: Intrauterine gestational sac: Single Yolk sac:  Visualized. Embryo:  Visualized. Cardiac Activity: Visualized. Heart Rate: 171 bpm CRL:   24 mm   9 w 0 d                  [place] EDC: 09/21/2020 Subchorionic hemorrhage: Small volume subchorionic hemorrhage is noted. Maternal uterus/adnexae: Normal appearance of the gravid anteverted uterus. Corpus luteum suspected in the left ovary. No concerning adnexal lesions. No free fluid. IMPRESSION: Single intrauterine gestation with at a gestational age of [redacted] weeks, 0 days by crown-rump length sonographic estimation. Small volume subchorionic hemorrhage is noted. No other acute complication.  Electronically Signed   By: Kreg Shropshire M.D.   On: 02/17/2020 01:09     Assessment and Plan  Abdominal pain affecting pregnancy  Intrauterine pregnancy - Advised to start [facility] Short interval between pregnancies affecting pregnancy, antepartum - Advised of high risk with closely spaced pregnancies  Previous cesarean section complicating pregnancy - Advised of high risk with closely spaced pregnancies  Subchorionic hematoma in first trimester, single or unspecified fetus - Information provided on [facility]   Threatened miscarriage in early pregnancy - Information provided on threat miscarriage   Elevated blood pressure affecting pregnancy in first trimester, antepartum - Advised may need to be restarted on BP medication  - Discharge patient - Get started with [facility] -- Patient discloses that she may not keep this pregnancy d/t timing, her health, having a little baby at home already, and finances - Patient verbalized an understanding of the plan of care and agrees.    Raelyn Mora, MSN, CNM 02/17/2020, 12:26 AM

## 2020-02-17 NOTE — Discharge Instructions (Signed)
[facility] Prenatal Care Providers          [facility] locations:  Hours may vary. Please call for an appointment  [facility] @ [facility]  [address] ([address] of [address])  [phone]  [facility] @ [facility]   [address]  [phone]  [facility] @ [place]       [address] [phone]            [facility] @ [place]     [address] [phone]          [facility] @ [place]   [address] [phone]  [facility] @ [facility]  [address] [phone]     [facility] @ [facility]  [address]   [phone]  Safe Medications in Pregnancy   Acne: Benzoyl Peroxide Salicylic Acid  Backache/Headache: Tylenol: 2 regular strength every 4 hours OR              2 Extra strength every 6 hours  Colds/Coughs/Allergies: Benadryl (alcohol free) 25 mg every 6 hours as needed Breath right strips Claritin Cepacol throat lozenges Chloraseptic throat spray Cold-Eeze- up to three times per day Cough drops, alcohol free Flonase (by prescription only) Guaifenesin Mucinex Robitussin DM (plain only, alcohol free) Saline nasal spray/drops Sudafed (pseudoephedrine) & Actifed ** use only after [redacted] weeks gestation and if you do not have high blood pressure Tylenol Vicks Vaporub Zinc lozenges Zyrtec   Constipation: Colace Ducolax suppositories Fleet enema Glycerin suppositories Metamucil Milk of magnesia Miralax Senokot Smooth move tea  Diarrhea: Kaopectate Imodium A-D  *NO pepto Bismol  Hemorrhoids: Anusol Anusol HC Preparation H Tucks  Indigestion: Tums Maalox Mylanta Zantac  Pepcid  Insomnia: Benadryl (alcohol free) 25mg every 6 hours as  needed Tylenol PM Unisom, no Gelcaps  Leg Cramps: Tums MagGel  Nausea/Vomiting:  Bonine Dramamine Emetrol Ginger extract Sea bands Meclizine  Nausea medication to take during pregnancy:  Unisom (doxylamine succinate 25 mg tablets) Take one tablet daily at bedtime. If symptoms are not adequately controlled, the dose can be increased to a maximum recommended dose of two tablets daily (1/2 tablet in the morning, 1/2 tablet mid-afternoon and one at bedtime). Vitamin B6 100mg tablets. Take one tablet twice a day (up to 200 mg per day).  Skin Rashes: Aveeno products Benadryl cream or 25mg every 6 hours as needed Calamine Lotion 1% cortisone cream  Yeast infection: Gyne-lotrimin 7 Monistat 7   **If taking multiple medications, please check labels to avoid duplicating the same active ingredients **take medication as directed on the label ** Do not exceed 4000 mg of tylenol in 24 hours **Do not take medications that contain aspirin or ibuprofen     

## 2020-02-18 LAB — GC/CHLAMYDIA PROBE AMP (~~LOC~~) NOT AT ARMC
Chlamydia: NEGATIVE
Comment: NEGATIVE
Comment: NORMAL
Neisseria Gonorrhea: NEGATIVE

## 2022-10-19 DIAGNOSIS — N179 Acute kidney failure, unspecified: Secondary | ICD-10-CM | POA: Diagnosis not present
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
## 2022-10-25 ENCOUNTER — Inpatient Hospital Stay (HOSPITAL_COMMUNITY)
Admission: AD | Admit: 2022-10-25 | Discharge: 2022-10-29 | DRG: 885 | Disposition: A | Discharge status: Home / Self Care | Payer: BC Managed Care – PPO | Source: Intra-hospital | Attending: Psychiatry | Admitting: Psychiatry

## 2022-10-25 ENCOUNTER — Encounter (HOSPITAL_COMMUNITY): Payer: Self-pay | Admitting: Psychiatry

## 2022-10-25 ENCOUNTER — Encounter (HOSPITAL_COMMUNITY): Payer: Self-pay

## 2022-10-25 ENCOUNTER — Other Ambulatory Visit: Payer: Self-pay

## 2022-10-25 DIAGNOSIS — K30 Functional dyspepsia: Secondary | ICD-10-CM | POA: Diagnosis present

## 2022-10-25 DIAGNOSIS — F17210 Nicotine dependence, cigarettes, uncomplicated: Secondary | ICD-10-CM | POA: Diagnosis present

## 2022-10-25 DIAGNOSIS — F332 Major depressive disorder, recurrent severe without psychotic features: Principal | ICD-10-CM | POA: Diagnosis present

## 2022-10-25 DIAGNOSIS — Z79899 Other long term (current) drug therapy: Secondary | ICD-10-CM | POA: Diagnosis not present

## 2022-10-25 DIAGNOSIS — F1120 Opioid dependence, uncomplicated: Secondary | ICD-10-CM | POA: Insufficient documentation

## 2022-10-25 DIAGNOSIS — F411 Generalized anxiety disorder: Secondary | ICD-10-CM | POA: Diagnosis present

## 2022-10-25 DIAGNOSIS — E1165 Type 2 diabetes mellitus with hyperglycemia: Secondary | ICD-10-CM | POA: Diagnosis present

## 2022-10-25 DIAGNOSIS — E119 Type 2 diabetes mellitus without complications: Secondary | ICD-10-CM | POA: Diagnosis present

## 2022-10-25 DIAGNOSIS — G4700 Insomnia, unspecified: Secondary | ICD-10-CM | POA: Diagnosis present

## 2022-10-25 DIAGNOSIS — F1320 Sedative, hypnotic or anxiolytic dependence, uncomplicated: Secondary | ICD-10-CM | POA: Diagnosis present

## 2022-10-25 DIAGNOSIS — T450X2A Poisoning by antiallergic and antiemetic drugs, intentional self-harm, initial encounter: Secondary | ICD-10-CM | POA: Diagnosis present

## 2022-10-25 DIAGNOSIS — F329 Major depressive disorder, single episode, unspecified: Principal | ICD-10-CM | POA: Diagnosis present

## 2022-10-25 DIAGNOSIS — F1123 Opioid dependence with withdrawal: Secondary | ICD-10-CM | POA: Diagnosis present

## 2022-10-25 DIAGNOSIS — Z794 Long term (current) use of insulin: Secondary | ICD-10-CM

## 2022-10-25 DIAGNOSIS — M62838 Other muscle spasm: Secondary | ICD-10-CM | POA: Diagnosis present

## 2022-10-25 LAB — GLUCOSE, CAPILLARY
Glucose-Capillary: 297 mg/dL — ABNORMAL HIGH (ref 70–99)
Glucose-Capillary: 404 mg/dL — ABNORMAL HIGH (ref 70–99)
Glucose-Capillary: 381 mg/dL — ABNORMAL HIGH (ref 70–99)
Glucose-Capillary: 440 mg/dL — ABNORMAL HIGH (ref 70–99)
Glucose-Capillary: 392 mg/dL — ABNORMAL HIGH (ref 70–99)
Glucose-Capillary: 243 mg/dL — ABNORMAL HIGH (ref 70–99)

## 2022-10-25 MED ORDER — INSULIN DETEMIR 100 UNIT/ML ~~LOC~~ SOLN
24.0000 [IU] | Freq: Every day | SUBCUTANEOUS | Status: DC
  Administered 2022-10-25 – 2022-10-26 (×2): 24 [IU] via SUBCUTANEOUS

## 2022-10-25 MED ORDER — BUPROPION HCL ER (XL) 150 MG PO TB24: 150.0000 mg | ORAL_TABLET | Freq: Every morning | ORAL | Status: DC

## 2022-10-25 MED ORDER — ONDANSETRON 4 MG PO TBDP
4.0000 mg | ORAL_TABLET | Freq: Four times a day (QID) | ORAL | Status: DC | PRN
  Administered 2022-10-25 – 2022-10-26 (×2): 4 mg via ORAL
  Filled 2022-10-25 (×2): qty 1

## 2022-10-25 MED ORDER — NICOTINE POLACRILEX 2 MG MT GUM
CHEWING_GUM | OROMUCOSAL | Status: AC
  Filled 2022-10-25: qty 1

## 2022-10-25 MED ORDER — FLUOXETINE HCL 20 MG PO CAPS
20.0000 mg | ORAL_CAPSULE | Freq: Every day | ORAL | Status: DC
  Administered 2022-10-26 – 2022-10-28 (×3): 20 mg via ORAL
  Filled 2022-10-25 (×6): qty 1

## 2022-10-25 MED ORDER — HYDROXYZINE HCL 25 MG PO TABS
25.0000 mg | ORAL_TABLET | Freq: Three times a day (TID) | ORAL | Status: DC | PRN
  Administered 2022-10-25 – 2022-10-28 (×12): 25 mg via ORAL
  Filled 2022-10-25 (×12): qty 1

## 2022-10-25 MED ORDER — TRAZODONE HCL 50 MG PO TABS
50.0000 mg | ORAL_TABLET | Freq: Every evening | ORAL | Status: DC | PRN
  Administered 2022-10-25 – 2022-10-28 (×5): 50 mg via ORAL
  Filled 2022-10-25 (×5): qty 1

## 2022-10-25 MED ORDER — NICOTINE 14 MG/24HR TD PT24
14.0000 mg | MEDICATED_PATCH | Freq: Every day | TRANSDERMAL | Status: DC
  Filled 2022-10-25 (×3): qty 1

## 2022-10-25 MED ORDER — CLONIDINE HCL 0.1 MG PO TABS
0.1000 mg | ORAL_TABLET | ORAL | Status: DC | PRN
  Administered 2022-10-25 – 2022-10-29 (×5): 0.1 mg via ORAL
  Filled 2022-10-25 (×5): qty 1

## 2022-10-25 MED ORDER — METHOCARBAMOL 500 MG PO TABS
500.0000 mg | ORAL_TABLET | Freq: Three times a day (TID) | ORAL | Status: DC | PRN
  Administered 2022-10-28 – 2022-10-29 (×2): 500 mg via ORAL
  Filled 2022-10-25 (×2): qty 1

## 2022-10-25 MED ORDER — NICOTINE POLACRILEX 2 MG MT GUM
2.0000 mg | CHEWING_GUM | OROMUCOSAL | Status: DC | PRN
  Administered 2022-10-25 – 2022-10-29 (×11): 2 mg via ORAL
  Filled 2022-10-25: qty 1

## 2022-10-25 MED ORDER — BUPROPION HCL ER (SR) 100 MG PO TB12
100.0000 mg | ORAL_TABLET | Freq: Every day | ORAL | Status: DC
  Administered 2022-10-26 – 2022-10-29 (×4): 100 mg via ORAL
  Filled 2022-10-25 (×7): qty 1

## 2022-10-25 MED ORDER — BUPROPION HCL ER (XL) 150 MG PO TB24
150.0000 mg | ORAL_TABLET | Freq: Every morning | ORAL | Status: DC
  Administered 2022-10-25: 150 mg via ORAL
  Filled 2022-10-25 (×4): qty 1

## 2022-10-25 MED ORDER — ALUM & MAG HYDROXIDE-SIMETH 200-200-20 MG/5ML PO SUSP: 30.0000 mL | ORAL | Status: DC | PRN

## 2022-10-25 MED ORDER — INSULIN ASPART 100 UNIT/ML IJ SOLN
6.0000 [IU] | Freq: Three times a day (TID) | INTRAMUSCULAR | Status: DC
  Administered 2022-10-25 – 2022-10-27 (×5): 6 [IU] via SUBCUTANEOUS

## 2022-10-25 MED ORDER — LOPERAMIDE HCL 2 MG PO CAPS: 2.0000 mg | ORAL_CAPSULE | ORAL | Status: DC | PRN

## 2022-10-25 MED ORDER — MAGNESIUM HYDROXIDE 400 MG/5ML PO SUSP: 30.0000 mL | Freq: Every day | ORAL | Status: DC | PRN

## 2022-10-25 MED ORDER — INSULIN DETEMIR 100 UNIT/ML ~~LOC~~ SOLN: 20.0000 [IU] | Freq: Every day | SUBCUTANEOUS | Status: DC

## 2022-10-25 MED ORDER — ACETAMINOPHEN 325 MG PO TABS
650.0000 mg | ORAL_TABLET | Freq: Four times a day (QID) | ORAL | Status: DC | PRN
  Administered 2022-10-25 – 2022-10-28 (×7): 650 mg via ORAL
  Filled 2022-10-25 (×7): qty 2

## 2022-10-25 MED ORDER — DICYCLOMINE HCL 20 MG PO TABS: 20.0000 mg | ORAL_TABLET | Freq: Four times a day (QID) | ORAL | Status: DC | PRN

## 2022-10-25 MED ORDER — INSULIN ASPART 100 UNIT/ML IJ SOLN
0.0000 [IU] | Freq: Three times a day (TID) | INTRAMUSCULAR | Status: DC
  Administered 2022-10-25 – 2022-10-26 (×4): 9 [IU] via SUBCUTANEOUS
  Administered 2022-10-26: 5 [IU] via SUBCUTANEOUS
  Administered 2022-10-26: 2 [IU] via SUBCUTANEOUS
  Administered 2022-10-27: 12 [IU] via SUBCUTANEOUS
  Administered 2022-10-27: 5 [IU] via SUBCUTANEOUS
  Administered 2022-10-28: 9 [IU] via SUBCUTANEOUS
  Administered 2022-10-28: 5 [IU] via SUBCUTANEOUS

## 2022-10-25 MED ORDER — NAPROXEN 500 MG PO TABS
500.0000 mg | ORAL_TABLET | Freq: Two times a day (BID) | ORAL | Status: DC | PRN
  Administered 2022-10-26 – 2022-10-28 (×5): 500 mg via ORAL
  Filled 2022-10-25 (×5): qty 1

## 2022-10-25 MED ORDER — SERTRALINE HCL 100 MG PO TABS
100.0000 mg | ORAL_TABLET | Freq: Every day | ORAL | Status: DC
  Administered 2022-10-25: 100 mg via ORAL
  Filled 2022-10-25 (×3): qty 1

## 2022-10-25 MED ORDER — INSULIN ASPART 100 UNIT/ML IJ SOLN
0.0000 [IU] | Freq: Every day | INTRAMUSCULAR | Status: DC
  Administered 2022-10-25 – 2022-10-26 (×2): 2 [IU] via SUBCUTANEOUS
  Administered 2022-10-27: 3 [IU] via SUBCUTANEOUS
  Administered 2022-10-28: 2 [IU] via SUBCUTANEOUS
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
## 2022-10-25 NOTE — BH IP Treatment Plan (Signed)
Interdisciplinary Treatment and Diagnostic Plan Update  10/25/2022 Time of Session: 11:20 AM  Kathryn Gilbert MRN: [id number]  Principal Diagnosis: MDD (major depressive disorder)  Secondary Diagnoses: Principal Problem:   MDD (major depressive disorder) Active Problems:   Severe benzodiazepine use disorder (HCC)   Opioid use disorder, severe, in controlled environment (HCC)   Current Medications:  Current Facility-Administered Medications  Medication Dose Route Frequency Provider Last Rate Last Admin   acetaminophen (TYLENOL) tablet 650 mg  650 mg Oral Q6H PRN Oneta Rack, NP   650 mg at 10/25/22 0831   alum & mag hydroxide-simeth (MAALOX/MYLANTA) 200-200-20 MG/5ML suspension 30 mL  30 mL Oral Q4H PRN Oneta Rack, NP       buPROPion (WELLBUTRIN XL) 24 hr tablet 150 mg  150 mg Oral q morning Armandina Stammer I, NP   150 mg at 10/25/22 1149   hydrOXYzine (ATARAX) tablet 25 mg  25 mg Oral TID PRN Onuoha, Chinwendu V, NP   25 mg at 10/25/22 0829   insulin aspart (novoLOG) injection 0-5 Units  0-5 Units Subcutaneous QHS Lewis, Jerene Pitch, NP       insulin aspart (novoLOG) injection 0-9 Units  0-9 Units Subcutaneous TID WC Oneta Rack, NP   9 Units at 10/25/22 1145   insulin aspart (novoLOG) injection 6 Units  6 Units Subcutaneous TID WC Massengill, Harrold Donath, MD       insulin detemir (LEVEMIR) injection 24 Units  24 Units Subcutaneous QHS Massengill, Nathan, MD       magnesium hydroxide (MILK OF MAGNESIA) suspension 30 mL  30 mL Oral Daily PRN Oneta Rack, NP       nicotine (NICODERM CQ - dosed in mg/24 hours) patch 14 mg  14 mg Transdermal Daily Massengill, Nathan, MD       traZODone (DESYREL) tablet 50 mg  50 mg Oral QHS PRN Oneta Rack, NP   50 mg at 10/25/22 0136   PTA Medications: Medications Prior to Admission  Medication Sig Dispense Refill Last Dose   buPROPion (WELLBUTRIN XL) 150 MG 24 hr tablet Take 150 mg by mouth every morning.      Continuous Glucose Transmitter  (DEXCOM G6 TRANSMITTER) MISC EVERY DAY AT BEDTIME**PA IN PROCESS      insulin aspart (NOVOLOG) 100 UNIT/ML injection Inject 1.8 Units into the skin continuous. Insulin pump  1.8 units/hour 1unit for every 10 gram of carbohydrates  1 unit for every 40 >150 sliding scale      sertraline (ZOLOFT) 100 MG tablet Take 100 mg by mouth daily.       Patient Stressors: Financial difficulties   Occupational concerns   Substance abuse    Patient Strengths: Supportive family/friends  Work skills   Treatment Modalities: Medication Management, Group therapy, Case management,  1 to 1 session with clinician, Psychoeducation, Recreational therapy.   Physician Treatment Plan for Primary Diagnosis: MDD (major depressive disorder) Long Term Goal(s): Improvement in symptoms so as ready for discharge   Short Term Goals: Ability to identify changes in lifestyle to reduce recurrence of condition will improve Ability to verbalize feelings will improve Ability to disclose and discuss suicidal ideas Ability to demonstrate self-control will improve  Medication Management: Evaluate patient's response, side effects, and tolerance of medication regimen.  Therapeutic Interventions: 1 to 1 sessions, Unit Group sessions and Medication administration.  Evaluation of Outcomes: Not Progressing  Physician Treatment Plan for Secondary Diagnosis: Principal Problem:   MDD (major depressive disorder) Active Problems:   Severe  benzodiazepine use disorder (HCC)   Opioid use disorder, severe, in controlled environment (HCC)  Long Term Goal(s): Improvement in symptoms so as ready for discharge   Short Term Goals: Ability to identify changes in lifestyle to reduce recurrence of condition will improve Ability to verbalize feelings will improve Ability to disclose and discuss suicidal ideas Ability to demonstrate self-control will improve     Medication Management: Evaluate patient's response, side effects, and  tolerance of medication regimen.  Therapeutic Interventions: 1 to 1 sessions, Unit Group sessions and Medication administration.  Evaluation of Outcomes: Not Progressing   RN Treatment Plan for Primary Diagnosis: MDD (major depressive disorder) Long Term Goal(s): Knowledge of disease and therapeutic regimen to maintain health will improve  Short Term Goals: Ability to remain free from injury will improve, Ability to verbalize frustration and anger appropriately will improve, Ability to demonstrate self-control, Ability to participate in decision making will improve, Ability to verbalize feelings will improve, Ability to disclose and discuss suicidal ideas, Ability to identify and develop effective coping behaviors will improve, and Compliance with prescribed medications will improve  Medication Management: RN will administer medications as ordered by provider, will assess and evaluate patient's response and provide education to patient for prescribed medication. RN will report any adverse and/or side effects to prescribing provider.  Therapeutic Interventions: 1 on 1 counseling sessions, Psychoeducation, Medication administration, Evaluate responses to treatment, Monitor vital signs and CBGs as ordered, Perform/monitor CIWA, COWS, AIMS and Fall Risk screenings as ordered, Perform wound care treatments as ordered.  Evaluation of Outcomes: Not Progressing   LCSW Treatment Plan for Primary Diagnosis: MDD (major depressive disorder) Long Term Goal(s): Safe transition to appropriate next level of care at discharge, Engage patient in therapeutic group addressing interpersonal concerns.  Short Term Goals: Engage patient in aftercare planning with referrals and resources, Increase social support, Increase ability to appropriately verbalize feelings, Increase emotional regulation, Facilitate acceptance of mental health diagnosis and concerns, Facilitate patient progression through stages of change  regarding substance use diagnoses and concerns, Identify triggers associated with mental health/substance abuse issues, and Increase skills for wellness and recovery  Therapeutic Interventions: Assess for all discharge needs, 1 to 1 time with Social worker, Explore available resources and support systems, Assess for adequacy in community support network, Educate family and significant other(s) on suicide prevention, Complete Psychosocial Assessment, Interpersonal group therapy.  Evaluation of Outcomes: Not Progressing   Progress in Treatment: Attending groups: Yes. Participating in groups: Yes. Taking medication as prescribed: Yes. Toleration medication: Yes. Family/Significant other contact made: No, will contact:  Whoever patient give CSW permission to speak with  Patient understands diagnosis: Yes. Discussing patient identified problems/goals with staff: Yes. Medical problems stabilized or resolved: Yes. Denies suicidal/homicidal ideation: Yes. Issues/concerns per patient self-inventory: No.   New problem(s) identified: No, Describe:  None reported   New Short Term/Long Term Goal(s):detox, medication management for mood stabilization; elimination of SI thoughts; development of comprehensive mental wellness/sobriety plan  Patient Goals:  " Treat my addiction , grief counseling/therapy, and get stable on medication "   Discharge Plan or Barriers: Patient recently admitted. CSW will continue to follow and assess for appropriate referrals and possible discharge planning.    Reason for Continuation of Hospitalization: Anxiety Depression Medication stabilization Suicidal ideation Withdrawal symptoms  Estimated Length of Stay: 3-5 days   Last 3 [place] Suicide Severity Risk Score: Flowsheet Row Ad[facility] (Current[facility]  C-SSRS RISK CATEGORY High Risk  Last PHQ 2/9 Scores:     No data to display           Scribe for Treatment Team: Beather Arbour 10/25/2022 1:52 PM

## 2022-10-25 NOTE — Group Note (Unsigned)
Date:  10/25/2022 Time:  10:08 AM  Group Topic/Focus:  Goals Group:   The focus of this group is to help patients establish daily goals to achieve during treatment and discuss how the patient can incorporate goal setting into their daily lives to aide in recovery. Orientation:   The focus of this group is to educate the patient on the purpose and policies of crisis stabilization and provide a format to answer questions about their admission.  The group details unit policies and expectations of patients while admitted.     Participation Level:  {BHH PARTICIPATION ZOXWR:60454}  Participation Quality:  {BHH PARTICIPATION QUALITY:22265}  Affect:  {BHH AFFECT:22266}  Cognitive:  {BHH COGNITIVE:22267}  Insight: {BHH Insight2:20797}  Engagement in Group:  {BHH ENGAGEMENT IN UJWJX:91478}  Modes of Intervention:  {BHH MODES OF INTERVENTION:22269}  Additional Comments:  ***  [name] 10/25/2022, 10:08 AM

## 2022-10-25 NOTE — Group Note (Signed)
Date:  10/25/2022 Time:  6:14 PM  Group Topic/Focus:  Wellness Toolbox:   The focus of this group is to discuss various aspects of wellness, balancing those aspects and exploring ways to increase the ability to experience wellness.  Patients will create a wellness toolbox for use upon discharge.    Participation Level:  Active  Participation Quality:  Appropriate  Affect:  Appropriate  Cognitive:  Appropriate  Insight: Appropriate  Engagement in Group:  Engaged  Modes of Intervention:  Discussion  Additional Comments:    [name] 10/25/2022, 6:14 PM

## 2022-10-25 NOTE — Group Note (Signed)
Recreation Therapy Group Note   Group Topic:Problem Solving  Group Date: 10/25/2022 Start Time: 0932 End Time: 1012 Facilitators: Brigitt Mcclish-McCall, LRT,CTRS Location: 300 [address] Dayroom   Goal Area(s) Addresses:  Patient will effectively work with peer towards shared goal.  Patient will identify skills used to make activity successful.  Patient will share challenges and verbalize solution-driven approaches used. Patient will identify how skills used during activity can be used to reach post d/c goals.   Group Description:  [employer]. Patients were provided the following materials: 4 drinking straws, 5 rubber bands, 5 paper clips, 2 index cards and 2 drinking cups. Using the provided materials patients were asked to build a launching mechanism to launch a ping pong ball across the room, approximately 10 feet. Patients were divided into teams of 3-5. Instructions required all materials be incorporated into the device, functionality of items left to the peer group's discretion.   Affect/Mood: Appropriate   Participation Level: Engaged   Participation Quality: Independent   Behavior: Appropriate   Speech/Thought Process: Focused   Insight: Good   Judgement: Good   Modes of Intervention: STEM Activity   Patient Response to Interventions:  Engaged   Education Outcome:  Acknowledges education   Clinical Observations/Individualized Feedback: Pt attended and engaged in group session.     Plan: Continue to engage patient in RT group sessions 2-3x/week.   Ivianna Notch-McCall, LRT,CTRS  10/25/2022 12:22 PM
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
## 2022-10-25 NOTE — Group Note (Unsigned)
Date:  10/25/2022 Time:  6:22 PM  Group Topic/Focus:  Wellness Toolbox:   The focus of this group is to discuss various aspects of wellness, balancing those aspects and exploring ways to increase the ability to experience wellness.  Patients will create a wellness toolbox for use upon discharge.     Participation Level:  {BHH PARTICIPATION LEVEL:22264}  Participation Quality:  {BHH PARTICIPATION QUALITY:22265}  Affect:  {BHH AFFECT:22266}  Cognitive:  {BHH COGNITIVE:22267}  Insight: {BHH Insight2:20797}  Engagement in Group:  {BHH ENGAGEMENT IN GROUP:22268}  Modes of Intervention:  {BHH MODES OF INTERVENTION:22269}  Additional Comments:  ***  Kathryn Gilbert 10/25/2022, 6:22 PM  

## 2022-10-25 NOTE — Progress Notes (Signed)
   10/25/22 1610  15 Minute Checks  Location Dayroom  Visual Appearance Calm  Behavior Composed  Sleep (Behavioral Health Patients Only)  Calculate sleep? (Click Yes once per 24 hr at 0600 safety check) Yes  Documented sleep last 24 hours 3.5
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
## 2022-10-25 NOTE — Tx Team (Signed)
Initial Treatment Plan 10/25/2022 3:19 AM [name] UJW:[id number]    PATIENT STRESSORS: Financial difficulties   Occupational concerns   Substance abuse     PATIENT STRENGTHS: Supportive family/friends  Work skills    PATIENT IDENTIFIED PROBLEMS: DM   Drug overdose  Suicidal attempt  "Addiction"  "Coping Skills"             DISCHARGE CRITERIA:  Ability to meet basic life and health needs Improved stabilization in mood, thinking, and/or behavior Motivation to continue treatment in a less acute level of care Reduction of life-threatening or endangering symptoms to within safe limits  PRELIMINARY DISCHARGE PLAN: Attend aftercare/continuing care group Return to previous living arrangement Return to previous work or school arrangements  PATIENT/FAMILY INVOLVEMENT: This treatment plan has been presented to and reviewed with the patient, Kathryn Gilbert, and/or family member.  The patient and family have been given the opportunity to ask questions and make suggestions.  Wendie Simmer, RN 10/25/2022, 3:19 AM

## 2022-10-25 NOTE — Inpatient Diabetes Management (Signed)
Inpatient Diabetes Program Recommendations  AACE/ADA: New Consensus Statement on Inpatient Glycemic Control (2015)  Target Ranges:  Prepandial:   less than 140 mg/dL      Peak postprandial:   less than 180 mg/dL (1-2 hours)      Critically ill patients:  140 - 180 mg/dL   Lab Results  Component Value Date   GLUCAP 440 (H) 10/25/2022    Review of Glycemic Control  Diabetes history: DM1 Outpatient Diabetes medications: Insulin pump Current orders for Inpatient glycemic control: Levemir 20 QHS, Novolog 0-9 units TID with meals and 0-5 HS  Inject 1.6 Units into the skin continuous. Insulin pump  1.6 units/hour  1 unit for every 10 gram of carbohydrates  1 unit for every 40 >150 sliding scale   Inpatient Diabetes Program Recommendations:    Consider increasing Semglee to 24 units QHS  Consider adding Novolog 6 units TID with meals if eating > 50%  HgbA1C pending.  Will follow glucose trends.   Thank you. Ailene Ards, RD, LDN, CDCES Inpatient Diabetes Coordinator [phone]

## 2022-10-25 NOTE — BHH Suicide Risk Assessment (Signed)
BHH INPATIENT:  Family/Significant Other Suicide Prevention Education  Suicide Prevention Education:  Patient Refusal for Family/Significant Other Suicide Prevention Education: The patient Kathryn Gilbert has refused to provide written consent for family/significant other to be provided Family/Significant Other Suicide Prevention Education during admission and/or prior to discharge.  Physician notified.  Corky Crafts 10/25/2022, 3:31 PM

## 2022-10-25 NOTE — Plan of Care (Signed)
  Problem: Education: Goal: Ability to describe self-care measures that may prevent or decrease complications (Diabetes Survival Skills Education) will improve Outcome: Progressing   Problem: Education: Goal: Knowledge of [place] [facility] Education information/materials will improve Outcome: Progressing   Problem: Activity: Goal: Sleeping patterns will improve Outcome: Progressing

## 2022-10-25 NOTE — BHH Group Notes (Signed)
BHH Group Notes:  (Nursing/MHT/Case Management/Adjunct)  Date:  10/25/2022  Time:  8:15 PM  Type of Therapy:   NA group  Participation Level:  Active  Participation Quality:  Appropriate  Affect:  Appropriate  Cognitive:  Appropriate  Insight:  Appropriate  Engagement in Group:  Engaged  Modes of Intervention:  Education  Summary of Progress/Problems: Attended NA meeting.  Kathryn Gilbert 10/25/2022, 8:15 PM

## 2022-10-25 NOTE — [facility] Suicide Risk Assessment (Signed)
Suicide Risk Assessment  Admission Assessment    [facility] Admission Suicide Risk Assessment   Nursing information obtained from:  Patient  Demographic factors:  NA  Current Mental Status:  Suicidal ideation indicated by patient  Loss Factors:  Financial problems / change in socioeconomic status  Historical Factors:  Prior suicide attempts, Family history of suicide  Risk Reduction Factors:  Positive social support, Sense of responsibility to family  Total Time spent with patient: 1 hour  Principal Problem: MDD (major depressive disorder)  Diagnosis:  Principal Problem:   MDD (major depressive disorder)  Subjective Data: See H&P  Continued Clinical Symptoms:    The "Alcohol Use Disorders Identification Test", Guidelines for Use in Primary Care, Second Edition.  World Science writer [facility]). Score between 0-7:  no or low risk or alcohol related problems. Score between 8-15:  moderate risk of alcohol related problems. Score between 16-19:  high risk of alcohol related problems. Score 20 or above:  warrants further diagnostic evaluation for alcohol dependence and treatment.  CLINICAL FACTORS:   Depression:   Comorbid alcohol abuse/dependence Impulsivity Severe Alcohol/Substance Abuse/Dependencies More than one psychiatric diagnosis Unstable or Poor Therapeutic Relationship Previous Psychiatric Diagnoses and Treatments  Musculoskeletal: Strength & Muscle Tone: within normal limits Gait & Station: normal Patient leans: N/A  Psychiatric Specialty Exam:  Presentation  General Appearance: Casual; Fairly Groomed  Eye Contact:Good  Speech:Clear and Coherent; Normal Rate  Speech Volume:Normal  Handedness:Right   Mood and Affect  Mood:Anxious; Depressed  Affect:Congruent  Thought Process  Thought Processes:Coherent; Goal Directed  Descriptions of Associations:Intact  Orientation:Full (Time, Place and Person)  Thought Content:Logical  History of  Schizophrenia/Schizoaffective disorder:No data recorded Duration of Psychotic Symptoms:No data recorded Hallucinations:Hallucinations: None  Ideas of Reference:None  Suicidal Thoughts:Suicidal Thoughts: No  Homicidal Thoughts:Homicidal Thoughts: No  Sensorium  Memory:Immediate Good; Recent Fair; Remote Fair  Judgment:Fair  Insight:Lacking  Executive Functions  Concentration:Fair  Attention Span:Fair  Recall:Fair  Fund of Knowledge:Fair  Language:Good  Psychomotor Activity  Psychomotor Activity:Psychomotor Activity: Normal  Assets  Assets:Communication Skills; Desire for Improvement; Financial Resources/Insurance; Housing; Social Support; Resilience; Physical Health  Sleep  Sleep:Sleep: Good Number of Hours of Sleep: 6.5  Physical Exam: See H&P Blood pressure 120/86, pulse 82, temperature 99.2 F (37.3 C), temperature source Oral, resp. rate 16, height 5\' 4"  (1.626 m), weight 62.6 kg, last menstrual period 10/16/2022, SpO2 99 %, not currently breastfeeding. Body mass index is 23.69 kg/m.  COGNITIVE FEATURES THAT CONTRIBUTE TO RISK:  Closed-mindedness, Polarized thinking, and Thought constriction (tunnel vision)    SUICIDE RISK:   Severe:  Frequent, intense, and enduring suicidal ideation, specific plan, no subjective intent, but some objective markers of intent (i.e., choice of lethal method), the method is accessible, some limited preparatory behavior, evidence of impaired self-control, severe dysphoria/symptomatology, multiple risk factors present, and few if any protective factors, particularly a lack of social support.  PLAN OF CARE: See H&P.  I certify that inpatient services furnished can reasonably be expected to improve the patient's condition.   Armandina Stammer, NP, pmhnp, fnp-bc. 10/25/2022, 11:31 AM

## 2022-10-25 NOTE — Group Note (Signed)
Date:  10/25/2022 Time:  10:23 AM  Group Topic/Focus:  Goals Group:   The focus of this group is to help patients establish daily goals to achieve during treatment and discuss how the patient can incorporate goal setting into their daily lives to aide in recovery. Orientation:   The focus of this group is to educate the patient on the purpose and policies of crisis stabilization and provide a format to answer questions about their admission.  The group details unit policies and expectations of patients while admitted.    Participation Level:  Active  Participation Quality:  Appropriate  Affect:  Appropriate  Cognitive:  Appropriate  Insight: Appropriate  Engagement in Group:  Engaged  Modes of Intervention:  Discussion  Additional Comments:    [name] 10/25/2022, 10:23 AM

## 2022-10-25 NOTE — Progress Notes (Signed)
   10/25/22 1425  Psych Admission Type (Psych Patients Only)  Admission Status Involuntary  Psychosocial Assessment  Patient Complaints Anxiety  Eye Contact Fair  Facial Expression Animated  Affect Appropriate to circumstance  Speech Logical/coherent  Interaction Assertive  Motor Activity Other (Comment) (wdl)  Appearance/Hygiene Unremarkable  Behavior Characteristics Cooperative  Mood Pleasant  Thought Process  Coherency WDL  Content WDL  Delusions None reported or observed  Perception WDL  Hallucination None reported or observed  Judgment Poor  Confusion None  Danger to Self  Current suicidal ideation? Denies  Agreement Not to Harm Self Yes  Description of Agreement verbal  Danger to Others  Danger to Others None reported or observed

## 2022-10-26 LAB — GLUCOSE, CAPILLARY
Glucose-Capillary: 257 mg/dL — ABNORMAL HIGH (ref 70–99)
Glucose-Capillary: 64 mg/dL — ABNORMAL LOW (ref 70–99)
Glucose-Capillary: 166 mg/dL — ABNORMAL HIGH (ref 70–99)
Glucose-Capillary: 359 mg/dL — ABNORMAL HIGH (ref 70–99)
Glucose-Capillary: 268 mg/dL — ABNORMAL HIGH (ref 70–99)
Glucose-Capillary: 247 mg/dL — ABNORMAL HIGH (ref 70–99)

## 2022-10-26 LAB — HEMOGLOBIN A1C
Hgb A1c MFr Bld: 9.6 % — ABNORMAL HIGH (ref 4.8–5.6)
Mean Plasma Glucose: 229 mg/dL

## 2022-10-26 MED ORDER — GABAPENTIN 100 MG PO CAPS: 200.0000 mg | ORAL_CAPSULE | Freq: Three times a day (TID) | ORAL | Status: DC

## 2022-10-26 MED ORDER — LISINOPRIL 5 MG PO TABS
5.0000 mg | ORAL_TABLET | Freq: Every day | ORAL | Status: DC
  Administered 2022-10-27 – 2022-10-29 (×3): 5 mg via ORAL
  Filled 2022-10-26 (×4): qty 1

## 2022-10-26 MED ORDER — GABAPENTIN 100 MG PO CAPS
200.0000 mg | ORAL_CAPSULE | Freq: Three times a day (TID) | ORAL | Status: DC
  Administered 2022-10-26 – 2022-10-28 (×6): 200 mg via ORAL
  Filled 2022-10-26 (×15): qty 2

## 2022-10-26 MED ORDER — LISINOPRIL 10 MG PO TABS
10.0000 mg | ORAL_TABLET | Freq: Once | ORAL | Status: AC
  Administered 2022-10-26: 10 mg via ORAL
  Filled 2022-10-26: qty 1

## 2022-10-26 NOTE — Progress Notes (Signed)
Pt denied SI/HI/AVH this morning. Pt rated her depression a 6/10, anxiety a 7/10. Pt complains of withdrawal symptoms this morning including anxiety, tremors, nausea, sweating, and restlessness. PRN Hydroxyzine and Zofran administered per MAR. Pt has been pleasant, calm, and cooperative throughout the shift. RN provided support and encouragement to patient. Pt given scheduled medications as prescribed. Q15 min checks verified for safety. Patient verbally contracts for safety. Patient compliant with medications and treatment plan. Patient is interacting well on the unit. Pt is safe on the unit.   10/26/22 0900  Psych Admission Type (Psych Patients Only)  Admission Status Voluntary  Psychosocial Assessment  Patient Complaints Anxiety;Depression;Substance abuse  Eye Contact Fair  Facial Expression Anxious;Animated  Affect Appropriate to circumstance  Speech Logical/coherent  Interaction Assertive  Motor Activity Other (Comment) (WDL)  Appearance/Hygiene Unremarkable  Behavior Characteristics Cooperative;Appropriate to situation  Mood Anxious;Pleasant  Thought Process  Coherency WDL  Content WDL  Delusions None reported or observed  Perception WDL  Hallucination None reported or observed  Judgment Impaired  Confusion None  Danger to Self  Current suicidal ideation? Denies  Description of Suicide Plan No plan  Agreement Not to Harm Self Yes  Description of Agreement Pt verbally contracts for safety  Danger to Others  Danger to Others None reported or observed
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
## 2022-10-26 NOTE — Progress Notes (Signed)
   10/26/22 2105  Psych Admission Type (Psych Patients Only)  Admission Status Voluntary  Psychosocial Assessment  Patient Complaints Anxiety  Eye Contact Fair  Facial Expression Anxious  Affect Appropriate to circumstance  Speech Logical/coherent  Interaction Assertive  Motor Activity Other (Comment) (WNL)  Appearance/Hygiene Unremarkable  Behavior Characteristics Cooperative;Appropriate to situation  Mood Anxious  Thought Process  Coherency WDL;Tangential  Content WDL  Delusions None reported or observed  Perception WDL  Hallucination None reported or observed  Judgment Poor  Confusion None  Danger to Self  Current suicidal ideation? Denies  Agreement Not to Harm Self Yes  Description of Agreement verbal  Danger to Others  Danger to Others None reported or observed   Alert/oriented. Makes needs/concerns known to staff. Pleasant cooperative with staff. Denies SI/HI/A/V hallucinations. Med compliant. PRN med given with good effect. Patient states went to group. Will encourage compliance and progression towards goals. Verbally contracted for safety. Will continue to monitor.

## 2022-10-26 NOTE — Plan of Care (Signed)
  Problem: Safety: Goal: Periods of time without injury will increase Outcome: Progressing   

## 2022-10-26 NOTE — Progress Notes (Addendum)
   10/26/22 1338  Vital Signs  Pulse Rate 81  Pulse Rate Source Monitor  BP (!) 143/109  BP Method Automatic  Oxygen Therapy  SpO2 99 %   Patient's BP elevated. Patient is complaining of restlessness, anxiety, nausea, tremors, and sweating. PRN Clondine 0.1mg  given for COWS >9 and elevated BP.
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
## 2022-10-26 NOTE — [facility] Group Notes (Signed)
Spiritual care group facilitated by Chaplain Dyanne Carrel, [facility]  Group focused on topic of strength. Group members reflected on what thoughts and feelings emerge when they hear this topic. They then engaged in facilitated dialog around how strength is present in their lives. This dialog focused on representing what strength had been to them in their lives (images and patterns given) and what they saw as helpful in their life now (what they needed / wanted).  Activity drew on narrative framework.  Patient Progress: Pt attended group and was engaged in conversation.  Verbal participation was minimal.

## 2022-10-26 NOTE — [facility] Group Notes (Signed)
[facility] Group Notes:  (Nursing/MHT/Case Management/Adjunct)  Date:  10/26/2022  Time:  9:01 PM  Type of Therapy:   Wrap-up group  Participation Level:  Active  Participation Quality:  Appropriate  Affect:  Appropriate  Cognitive:  Appropriate  Insight:  Appropriate  Engagement in Group:  Engaged  Modes of Intervention:  Education  Summary of Progress/Problems: Pt goal to work on D/C. Day 6/10.  Kathryn Gilbert 10/26/2022, 9:01 PM

## 2022-10-27 DIAGNOSIS — F332 Major depressive disorder, recurrent severe without psychotic features: Principal | ICD-10-CM

## 2022-10-27 LAB — GLUCOSE, CAPILLARY
Glucose-Capillary: 91 mg/dL (ref 70–99)
Glucose-Capillary: 428 mg/dL — ABNORMAL HIGH (ref 70–99)
Glucose-Capillary: 252 mg/dL — ABNORMAL HIGH (ref 70–99)
Glucose-Capillary: 236 mg/dL — ABNORMAL HIGH (ref 70–99)

## 2022-10-27 MED ORDER — INSULIN DETEMIR 100 UNIT/ML ~~LOC~~ SOLN
11.0000 [IU] | Freq: Two times a day (BID) | SUBCUTANEOUS | Status: DC
  Administered 2022-10-27 – 2022-10-28 (×2): 11 [IU] via SUBCUTANEOUS

## 2022-10-27 MED ORDER — INSULIN ASPART 100 UNIT/ML IJ SOLN
8.0000 [IU] | Freq: Three times a day (TID) | INTRAMUSCULAR | Status: DC
  Administered 2022-10-27 – 2022-10-28 (×4): 8 [IU] via SUBCUTANEOUS

## 2022-10-27 MED ORDER — INSULIN DETEMIR 100 UNIT/ML ~~LOC~~ SOLN: 22.0000 [IU] | Freq: Every day | SUBCUTANEOUS | Status: DC

## 2022-10-27 NOTE — Group Note (Signed)
Date:  10/27/2022 Time:  4:21 PM  Group Topic/Focus:  Goals Group:   The focus of this group is to help patients establish daily goals to achieve during treatment and discuss how the patient can incorporate goal setting into their daily lives to aide in recovery. Orientation:   The focus of this group is to educate the patient on the purpose and policies of crisis stabilization and provide a format to answer questions about their admission.  The group details unit policies and expectations of patients while admitted.    Participation Level:  Active  Participation Quality:  Appropriate  Affect:  Appropriate  Cognitive:  Appropriate  Insight: Appropriate  Engagement in Group:  Engaged  Modes of Intervention:  Activity, Discussion, and Orientation  Additional Comments:   Pt attended and participated in the Orientation/Goals group.  Edmund Hilda Itzel Mckibbin 10/27/2022, 4:21 PM

## 2022-10-27 NOTE — Progress Notes (Addendum)
Patient's blood sugar was 428 at 1200. Aggie, NP notified. Verbally ordered by Aggie, NP to administer 12 units of insulin sliding scale and 8 standard units of Novolog prior to lunch. 20 total units of insulin Novolog administered per NP verbal order prior to lunch.

## 2022-10-27 NOTE — Inpatient Diabetes Management (Signed)
Inpatient Diabetes Program Recommendations  AACE/ADA: New Consensus Statement on Inpatient Glycemic Control (2015)  Target Ranges:  Prepandial:   less than 140 mg/dL      Peak postprandial:   less than 180 mg/dL (1-2 hours)      Critically ill patients:  140 - 180 mg/dL   Lab Results  Component Value Date   GLUCAP 91 10/27/2022   HGBA1C 9.6 (H) 10/25/2022    Review of Glycemic Control  Latest Reference Range & Units 10/26/22 04:52 10/26/22 05:11 10/26/22 06:36 10/26/22 11:49 10/26/22 17:11 10/26/22 20:50 10/27/22 05:46  Glucose-Capillary 70 - 99 mg/dL 64 (L) 956 (H) 213 (H) 359 (H) 268 (H) 247 (H) 91  (L): Data is abnormally low (H): Data is abnormally high  Diabetes history: DM1 Outpatient Diabetes medications: Insulin pump? Current orders for Inpatient glycemic control: Levemir 24 QHS, Novolog 0-9 units TID with meals and 0-5 HS, Novolog 6 units TID  Inpatient Diabetes Program Recommendations:    Please consider decreasing Semglee to 22 units QHS Increasing meal coverage to Novolog 8 units TID  Will continue to follow while inpatient.  Thank you, Dulce Sellar, MSN, CDCES Diabetes Coordinator Inpatient Diabetes Program [phone] (team pager from 8a-5p)
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
## 2022-10-27 NOTE — Group Note (Signed)
Date:  10/27/2022 Time:  2:38 PM  Group Topic/Focus:  Emotional Education:   The focus of this group is to discuss what feelings/emotions are, and how they are experienced.    Participation Level:  Active  Participation Quality:  Appropriate  Affect:  Appropriate  Cognitive:  Alert  Insight: Appropriate  Engagement in Group:  Engaged  Modes of Intervention:  Education  Additional Comments:    Beckie Busing 10/27/2022, 2:38 PM

## 2022-10-27 NOTE — Progress Notes (Signed)
Pt did not attend group. 

## 2022-10-27 NOTE — Group Note (Signed)
Recreation Therapy Group Note   Group Topic:Other  Group Date: 10/27/2022 Start Time: 1300 End Time: 1400 Facilitators: Ariannie Penaloza-McCall, LRT,CTRS Location: [address] Dayroom   Activity Description/Intervention: Therapeutic Drumming. Patients with peers and staff were given the opportunity to engage in a leader facilitated [facility] Group Empowerment Drumming Circle with staff from the [employer], in partnership with [employer]. Teaching laboratory technician and trained [employer], Theodoro Doing leading with LRT observing and documenting intervention and pt response. This evidenced-based practice targets 7 areas of health and wellbeing in the human experience including: stress-reduction, exercise, self-expression, camaraderie/support, nurturing, spirituality, and music-making (leisure).   Goal Area(s) Addresses:  Patient will engage in pro-social way in music group.  Patient will follow directions of drum leader on the first prompt. Patient will demonstrate no behavioral issues during group.  Patient will identify if a reduction in stress level occurs as a result of participation in therapeutic drum circle.    Affect/Mood: Appropriate   Participation Level: Engaged   Participation Quality: Independent   Behavior: Appropriate   Speech/Thought Process: Focused   Insight: Good   Judgement: Good   Modes of Intervention: Teaching laboratory technician   Patient Response to Interventions:  Engaged   Education Outcome:  Acknowledges education   Clinical Observations/Individualized Feedback: Shadee actively engaged in therapeutic drumming exercise and discussions. Pt was appropriate with peers, staff, and musical equipment for duration of programming.  Pt identified "calm" as their feeling after participation in music-based programming. Pt affect congruent/incongruent with verbalized emotion.   Plan: Continue to engage patient in RT group sessions  2-3x/week.   Guliana Weyandt-McCall, LRT,CTRS 10/27/2022 2:34 PM

## 2022-10-27 NOTE — Progress Notes (Signed)
[facility] MD Progress Note  10/27/2022 12:33 PM Kathryn Gilbert  MRN:  [id number]  Reason for admission: 32 year old Caucasian female with hx of opioid use disorder, major depressive disorder & benzodiazepine use disorder. Admitted to the [facility] from the [facility] with complaint of suicide attempt on 15 tablets of Benadryl 25 mg per tablet. After the attempt, patient's husband apparently called 911 & was transported to the hospital for treatment. On arrival to the hospital ED, patient was taken to the ICU where she received treatments (IV insulin infusion) for hyperglycemic episode that placed patinet her in a comatose state. After medical evaluation, treatments & stabilization, Kathryn Gilbert was recommended & transferred to the [facility] for further psychiatric evaluation/treatments.   Daily notes: Kathryn Gilbert is seen, chart reviewed. The chart findings discussed with the treatment team. She presents with an improving affect, good eye contact & verbally responsive. Kathryn Gilbert is visible on the unit, attending group sessions. She reports, I'm doing okay, but I need to talk to the doctor about my anxiety & discharge plan. I'm always anxious. My depression is a lot better & continues to improve. My sleep has improved as well. I also do not think that my insulin dose is right. I use a lot more dose (units) at home than I'm getting here. I have been attending & participating in the group sessions. The group sessions has been helpful to me. I would like to have a letter to give to my supervisor once I get back to work". Kathryn Gilbert currently denies any SIHI, AVH, delusional thoughts or paranoia. She does not appear to be responding to any internal stimuli. Although reporting feeling anxious, patient's behavior & mannerism seem a bit relaxed whenever patient is observed. She actively participates in the group sessions. She is socializing well with peer. Her presentation of good affect does not reflect her complaints. At this time, patient  is approaching her baseline for discharge. However, she was started on Gabapentin 200 mg tid yesterday to aid in managing her anxiety symptoms. This medication is still being adjust. The patient is being monitored to assure that she is tolerating this medication without side effects The diabetes coordinator has made some dose adjustment to both the Novolog insulin & LEVEMIR. See MAR. There are no other changes made on the current plan of care, will continue as already in progress. Reviewed vital signs, stable. See MAR.  Principal Problem: MDD (major depressive disorder)  Diagnosis: Principal Problem:   MDD (major depressive disorder) Active Problems:   Severe benzodiazepine use disorder (HCC)   Opioid use disorder, severe, in controlled environment (HCC)  Total Time spent with patient:  25 minutes  Past Psychiatric History: See H&P.  Past Medical History:  Past Medical History:  Diagnosis Date   Diabetes mellitus without complication (HCC)    Type 1   Herpes simplex virus type 1 (HSV-1) dermatitis    Hx of varicella    Kidney stones    age 28    Past Surgical History:  Procedure Laterality Date   CESAREAN SECTION     Family History:  Family History  Problem Relation Age of Onset   Hypertension Mother    Cancer Mother        skin   Hypertension Father    Family Psychiatric  History: See H&P.  Social History:  Social History   Substance and Sexual Activity  Alcohol Use Not Currently     Social History   Substance and Sexual Activity  Drug Use Yes  Types: Oxycodone   Comment: reports regular use of illicit Rx opiates    Social History   Socioeconomic History   Marital status: Married    Spouse name: Not on file   Number of children: 3   Years of education: Not on file   Highest education level: Not on file  Occupational History   Not on file  Tobacco Use   Smoking status: Every Day    Packs/day: 1.50    Years: 15.00    Additional pack years: 0.00     Total pack years: 22.50    Types: Cigarettes    Last attempt to quit: 01/23/2012    Years since quitting: 10.7    Passive exposure: Past   Smokeless tobacco: Never  Vaping Use   Vaping Use: Never used  Substance and Sexual Activity   Alcohol use: Not Currently   Drug use: Yes    Types: Oxycodone    Comment: reports regular use of illicit Rx opiates   Sexual activity: Yes  Other Topics Concern   Not on file  Social History Narrative   Not on file   Social Determinants of Health   Financial Resource Strain: Not on file  Food Insecurity: No Food Insecurity (10/25/2022)   Hunger Vital Sign    Worried About Running Out of Food in the Last Year: Never true    Ran Out of Food in the Last Year: Never true  Transportation Needs: No Transportation Needs (10/25/2022)   PRAPARE - Administrator, Civil Service (Medical): No    Lack of Transportation (Non-Medical): No  Physical Activity: Not on file  Stress: Not on file  Social Connections: Not on file   Additional Social History:   Sleep: Good  Appetite:  Good  Current Medications: Current Facility-Administered Medications  Medication Dose Route Frequency Provider Last Rate Last Admin   acetaminophen (TYLENOL) tablet 650 mg  650 mg Oral Q6H PRN Oneta Rack, NP   650 mg at 10/27/22 1057   alum & mag hydroxide-simeth (MAALOX/MYLANTA) 200-200-20 MG/5ML suspension 30 mL  30 mL Oral Q4H PRN Oneta Rack, NP       buPROPion ER (WELLBUTRIN SR) 12 hr tablet 100 mg  100 mg Oral Daily Massengill, Nathan, MD   100 mg at 10/27/22 0739   cloNIDine (CATAPRES) tablet 0.1 mg  0.1 mg Oral Q4H PRN Massengill, Harrold Donath, MD   0.1 mg at 10/26/22 2149   dicyclomine (BENTYL) tablet 20 mg  20 mg Oral Q6H PRN Massengill, Harrold Donath, MD       FLUoxetine (PROZAC) capsule 20 mg  20 mg Oral Daily Massengill, Nathan, MD   20 mg at 10/27/22 0739   gabapentin (NEURONTIN) capsule 200 mg  200 mg Oral Q8H Massengill, Nathan, MD   200 mg at 10/27/22 1610    hydrOXYzine (ATARAX) tablet 25 mg  25 mg Oral TID PRN Onuoha, Chinwendu V, NP   25 mg at 10/27/22 0740   insulin aspart (novoLOG) injection 0-5 Units  0-5 Units Subcutaneous QHS Oneta Rack, NP   2 Units at 10/26/22 2125   insulin aspart (novoLOG) injection 0-9 Units  0-9 Units Subcutaneous TID WC Oneta Rack, NP   12 Units at 10/27/22 1208   insulin aspart (novoLOG) injection 8 Units  8 Units Subcutaneous TID WC Phineas Inches, MD   8 Units at 10/27/22 1208   insulin detemir (LEVEMIR) injection 22 Units  22 Units Subcutaneous QHS Phineas Inches, MD  lisinopril (ZESTRIL) tablet 5 mg  5 mg Oral Daily Jaber Dunlow I, NP   5 mg at 10/27/22 0739   loperamide (IMODIUM) capsule 2-4 mg  2-4 mg Oral PRN Massengill, Harrold Donath, MD       magnesium hydroxide (MILK OF MAGNESIA) suspension 30 mL  30 mL Oral Daily PRN Oneta Rack, NP       methocarbamol (ROBAXIN) tablet 500 mg  500 mg Oral Q8H PRN Massengill, Harrold Donath, MD       naproxen (NAPROSYN) tablet 500 mg  500 mg Oral BID PRN Phineas Inches, MD   500 mg at 10/27/22 1610   nicotine polacrilex (NICORETTE) gum 2 mg  2 mg Oral PRN Massengill, Harrold Donath, MD   2 mg at 10/27/22 0806   ondansetron (ZOFRAN-ODT) disintegrating tablet 4 mg  4 mg Oral Q6H PRN Phineas Inches, MD   4 mg at 10/26/22 0804   traZODone (DESYREL) tablet 50 mg  50 mg Oral QHS PRN Oneta Rack, NP   50 mg at 10/26/22 2125   Lab Results:  Results for orders placed or performed during the hospital encounter of 10/25/22 (from the past 48 hour(s))  Glucose, capillary     Status: Abnormal   Collection Time: 10/25/22  5:03 PM  Result Value Ref Range   Glucose-Capillary 392 (H) 70 - 99 mg/dL    Comment: Glucose reference range applies only to samples taken after fasting for at least 8 hours.  Glucose, capillary     Status: Abnormal   Collection Time: 10/25/22  9:11 PM  Result Value Ref Range   Glucose-Capillary 243 (H) 70 - 99 mg/dL    Comment: Glucose reference  range applies only to samples taken after fasting for at least 8 hours.   Comment 1 Notify RN    Comment 2 Document in Chart   Glucose, capillary     Status: Abnormal   Collection Time: 10/26/22  4:52 AM  Result Value Ref Range   Glucose-Capillary 64 (L) 70 - 99 mg/dL    Comment: Glucose reference range applies only to samples taken after fasting for at least 8 hours.  Glucose, capillary     Status: Abnormal   Collection Time: 10/26/22  5:11 AM  Result Value Ref Range   Glucose-Capillary 257 (H) 70 - 99 mg/dL    Comment: Glucose reference range applies only to samples taken after fasting for at least 8 hours.  Glucose, capillary     Status: Abnormal   Collection Time: 10/26/22  6:36 AM  Result Value Ref Range   Glucose-Capillary 166 (H) 70 - 99 mg/dL    Comment: Glucose reference range applies only to samples taken after fasting for at least 8 hours.   Comment 1 Notify RN    Comment 2 Document in Chart   Glucose, capillary     Status: Abnormal   Collection Time: 10/26/22 11:49 AM  Result Value Ref Range   Glucose-Capillary 359 (H) 70 - 99 mg/dL    Comment: Glucose reference range applies only to samples taken after fasting for at least 8 hours.  Glucose, capillary     Status: Abnormal   Collection Time: 10/26/22  5:11 PM  Result Value Ref Range   Glucose-Capillary 268 (H) 70 - 99 mg/dL    Comment: Glucose reference range applies only to samples taken after fasting for at least 8 hours.  Glucose, capillary     Status: Abnormal   Collection Time: 10/26/22  8:50 PM  Result Value  Ref Range   Glucose-Capillary 247 (H) 70 - 99 mg/dL    Comment: Glucose reference range applies only to samples taken after fasting for at least 8 hours.   Comment 1 Notify RN    Comment 2 Document in Chart   Glucose, capillary     Status: None   Collection Time: 10/27/22  5:46 AM  Result Value Ref Range   Glucose-Capillary 91 70 - 99 mg/dL    Comment: Glucose reference range applies only to samples  taken after fasting for at least 8 hours.  Glucose, capillary     Status: Abnormal   Collection Time: 10/27/22 11:58 AM  Result Value Ref Range   Glucose-Capillary 428 (H) 70 - 99 mg/dL    Comment: Glucose reference range applies only to samples taken after fasting for at least 8 hours.   Blood Alcohol level:  Lab Results  Component Value Date   ETH 246 (H) 07/30/2011   Metabolic Disorder Labs: Lab Results  Component Value Date   HGBA1C 9.6 (H) 10/25/2022   MPG 229 10/25/2022   No results found for: "PROLACTIN" No results found for: "CHOL", "TRIG", "HDL", "CHOLHDL", "VLDL", "LDLCALC"  Physical Findings: AIMS:  , ,  ,  ,    CIWA:    COWS:  COWS Total Score: 2  Musculoskeletal: Strength & Muscle Tone: within normal limits Gait & Station: normal Patient leans: N/A  Psychiatric Specialty Exam:  Presentation  General Appearance:  Appropriate for Environment; Casual; Fairly Groomed  Eye Contact: Good  Speech: Clear and Coherent; Normal Rate  Speech Volume: Normal  Handedness: Right  Mood and Affect  Mood: -- (Improving)  Affect: Congruent  Thought Process  Thought Processes: Coherent; Goal Directed; Linear  Descriptions of Associations:Intact  Orientation:Full (Time, Place and Person)  Thought Content:Logical  History of Schizophrenia/Schizoaffective disorder:No data recorded Duration of Psychotic Symptoms:No data recorded Hallucinations:Hallucinations: None   Ideas of Reference:None  Suicidal Thoughts:Suicidal Thoughts: No   Homicidal Thoughts:No data recorded  Sensorium  Memory: Immediate Good; Remote Good  Judgment: Fair  Insight: Fair  Art therapist  Concentration: Good  Attention Span: Good  Recall: Good  Fund of Knowledge: Good  Language: Good  Psychomotor Activity  Psychomotor Activity: Psychomotor Activity: Normal  Assets  Assets: Communication Skills; Desire for Improvement; Financial  Resources/Insurance; Housing; Resilience; Social Support  Sleep  Sleep: Sleep: Good Number of Hours of Sleep: 8   Physical Exam: Physical Exam Vitals and nursing note reviewed.  HENT:     Head: Normocephalic.     Nose: Nose normal.     Mouth/Throat:     Pharynx: Oropharynx is clear.  Eyes:     Pupils: Pupils are equal, round, and reactive to light.  Cardiovascular:     Rate and Rhythm: Normal rate.     Pulses: Normal pulses.  Pulmonary:     Effort: Pulmonary effort is normal.  Genitourinary:    Comments: Deferred Musculoskeletal:        General: Normal range of motion.     Cervical back: Normal range of motion.  Skin:    General: Skin is warm and dry.  Neurological:     General: No focal deficit present.     Mental Status: She is alert and oriented to person, place, and time.    Review of Systems  Constitutional:  Negative for chills, diaphoresis and fever.  HENT:  Negative for congestion and sore throat.   Respiratory:  Negative for cough, shortness of breath and wheezing.  Cardiovascular:  Negative for chest pain and palpitations.  Gastrointestinal:  Negative for abdominal pain, diarrhea, heartburn, nausea and vomiting.  Genitourinary:  Negative for dysuria.  Musculoskeletal:  Negative for joint pain and myalgias.  Skin:  Negative for itching and rash.  Neurological:  Negative for dizziness, tingling, tremors, sensory change, speech change, focal weakness, seizures, loss of consciousness, weakness and headaches.  Endo/Heme/Allergies:        NKDA  Psychiatric/Behavioral:  Positive for depression (Improving), hallucinations and substance abuse (Hx opioid use disorder.). Negative for memory loss and suicidal ideas. The patient is nervous/anxious. The patient does not have insomnia.    Blood pressure 109/71, pulse 75, temperature 98 F (36.7 C), temperature source Oral, resp. rate 14, height 5\' 4"  (1.626 m), weight 62.6 kg, last menstrual period 10/16/2022, SpO2 100  %, not currently breastfeeding. Body mass index is 23.69 kg/m.  Treatment Plan Summary: Daily contact with patient to assess and evaluate symptoms and progress in treatment and Medication management.   Continue inpatient hospitalization.  Will continue today 10/27/2022 plan as below except where it is noted.   Principal/active diagnoses.  Major depressive disorder, recurrent episodes.  Opioid use disorder, chronic.  Benzodiazepine use disorder.    Medical conditions. Diabetes mellitus (insulin dependent).   Associated symptoms.  Suicidal ideations.  Suicide attempts.  Impulsive behaviors.   Plan: The risks/benefits/side-effects/alternatives to the medications in use were discussed in detail with the patient and time was given for patient's questions. The patient consents to medication trial.    Discontinued Wellbutrin XL 150 mg.  Continue Wellbutrin SR 100 mg po daily for depression.  Discontinued Sertraline 100 mg. Continue Prozac 20 mg po daily for depression. Continue Hydroxyzine 25 mg po tid prn for anxiety.  Continue Trazodone 50 mg po Q hs prn for insomnia.  Continue Nicoderm patch 14 mg topically Q 24 hrs for nicotine withdrawal.  Continue gabapentin 200 mg po tid for substance withdrawal syndrome.   Opioid intoxication..        Continue Clonidine 0.1 mg po Q 4 hrs prn for withdrawal management.  Continue Bentyl 20 mg po Q 6 hrs prn for abdominal spasms/cramps. Continue Imodium 2-4 mg po prn for diarrhea,  Continue Robaxin 500 mg Q 8 hrs prn for muscle spasms.  Continue Naproxen 500 mg po bid prn for pain. Continue Zofran-ODT 4 mg po Q 6 hrs prn for N/V.    Other medical issues.  Increased Novolog insulin to 8 units Subq tid with meals meal coverage. Changed Levemir insulin from 22 units subq Q hs to 11 units bid for DM. Continue the sliding scale insulin coverage as recommended. Completed Lisinopril 10 mg po once today for consistent elevated blood  pressure. Continue Lisinopril 5 mg po daily x 4 days starting tomorrow.   Other PRNS -Continue Tylenol 650 mg every 6 hours PRN for mild pain -Continue Maalox 30 ml Q 4 hrs PRN for indigestion -Continue MOM 30 ml po Q 6 hrs for constipation   Safety and Monitoring: Voluntary admission to inpatient psychiatric unit for safety, stabilization and treatment Daily contact with patient to assess and evaluate symptoms and progress in treatment Patient's case to be discussed in multi-disciplinary team meeting Observation Level : q15 minute checks Vital signs: q12 hours Precautions: Safety   Discharge Planning: Social work and case management to assist with discharge planning and identification of hospital follow-up needs prior to discharge Estimated LOS: 5-7 days Discharge Concerns: Need to establish a safety plan; Medication compliance and  effectiveness Discharge Goals: Return home with outpatient referrals for mental health follow-up including medication management/psychotherapy  Armandina Stammer, NP, pmhnp, fnp-bc. 10/27/2022, 12:33 PMPatient ID: Kathryn Gilbert, female   DOB: 05/23/1990, 32 y.o.   MRN: [id number]

## 2022-10-27 NOTE — Progress Notes (Addendum)
Pt denied SI/HI/AVH this morning. Pt rated her depression a 4/10 and her anxiety a 7/10. PRN Hydroxyzine administered per patient request for anxiety. Patient complains of right tooth pain, rating it a 6/10, PRN Tylenol administered for pain and heat pack provided. Pt has been pleasant, calm, and cooperative throughout the shift. RN provided support and encouragement to patient. Pt given scheduled medications as prescribed. Q15 min checks verified for safety. Patient verbally contracts for safety. Patient compliant with medications and treatment plan. Patient is interacting well on the unit. Pt is safe on the unit.   10/27/22 0900  Psych Admission Type (Psych Patients Only)  Admission Status Voluntary  Psychosocial Assessment  Patient Complaints Anxiety;Depression  Eye Contact Fair  Facial Expression Anxious;Sad  Affect Appropriate to circumstance  Speech Logical/coherent  Interaction Assertive  Motor Activity Other (Comment) (WDL)  Appearance/Hygiene Unremarkable  Behavior Characteristics Cooperative;Appropriate to situation  Mood Anxious;Depressed  Thought Process  Coherency Tangential  Content WDL  Delusions None reported or observed  Perception WDL  Hallucination None reported or observed  Judgment Impaired  Confusion None  Danger to Self  Current suicidal ideation? Denies  Description of Suicide Plan No plan  Agreement Not to Harm Self Yes  Description of Agreement Pt verbally contracts for safety  Danger to Others  Danger to Others None reported or observed

## 2022-10-27 NOTE — Group Note (Signed)
Date:  10/27/2022 Time:  6:47 PM  Group Topic/Focus:  Support and Check-in- Open Discussion/Journaling    Participation Level:  Active  Participation Quality:  Appropriate  Affect:  Appropriate  Cognitive:  Appropriate  Insight: Appropriate  Engagement in Group:  Engaged  Modes of Intervention:  Activity, Discussion, and Support  Additional Comments:   Pt  attended and actively participated in the support check in, journaling and open discussion group.  Kathryn Gilbert 10/27/2022, 6:47 PM

## 2022-10-28 LAB — GLUCOSE, CAPILLARY
Glucose-Capillary: 294 mg/dL — ABNORMAL HIGH (ref 70–99)
Glucose-Capillary: 480 mg/dL — ABNORMAL HIGH (ref 70–99)
Glucose-Capillary: 436 mg/dL — ABNORMAL HIGH (ref 70–99)
Glucose-Capillary: 279 mg/dL — ABNORMAL HIGH (ref 70–99)
Glucose-Capillary: 149 mg/dL — ABNORMAL HIGH (ref 70–99)
Glucose-Capillary: 242 mg/dL — ABNORMAL HIGH (ref 70–99)

## 2022-10-28 MED ORDER — GABAPENTIN 300 MG PO CAPS
300.0000 mg | ORAL_CAPSULE | Freq: Three times a day (TID) | ORAL | Status: DC
  Administered 2022-10-28 – 2022-10-29 (×3): 300 mg via ORAL
  Filled 2022-10-28 (×6): qty 1

## 2022-10-28 MED ORDER — NAPROXEN 500 MG PO TABS
500.0000 mg | ORAL_TABLET | Freq: Two times a day (BID) | ORAL | Status: DC | PRN
  Administered 2022-10-28 – 2022-10-29 (×2): 500 mg via ORAL
  Filled 2022-10-28 (×2): qty 1

## 2022-10-28 MED ORDER — FLUOXETINE HCL 20 MG PO CAPS
40.0000 mg | ORAL_CAPSULE | Freq: Every day | ORAL | Status: DC
  Administered 2022-10-29: 40 mg via ORAL
  Filled 2022-10-28 (×2): qty 2

## 2022-10-28 MED ORDER — INSULIN DETEMIR 100 UNIT/ML ~~LOC~~ SOLN
12.0000 [IU] | Freq: Two times a day (BID) | SUBCUTANEOUS | Status: DC
  Administered 2022-10-28 – 2022-10-29 (×2): 12 [IU] via SUBCUTANEOUS

## 2022-10-28 MED ORDER — INSULIN ASPART 100 UNIT/ML IJ SOLN
0.0000 [IU] | Freq: Three times a day (TID) | INTRAMUSCULAR | Status: DC
  Administered 2022-10-28: 2 [IU] via SUBCUTANEOUS
  Administered 2022-10-29: 15 [IU] via SUBCUTANEOUS
  Administered 2022-10-29: 3 [IU] via SUBCUTANEOUS

## 2022-10-28 MED ORDER — NAPROXEN 500 MG PO TABS: 500.0000 mg | ORAL_TABLET | Freq: Two times a day (BID) | ORAL | Status: DC

## 2022-10-28 MED ORDER — FLUOXETINE HCL 10 MG PO CAPS
10.0000 mg | ORAL_CAPSULE | ORAL | Status: AC
  Administered 2022-10-28: 10 mg via ORAL
  Filled 2022-10-28 (×2): qty 1

## 2022-10-28 MED ORDER — INSULIN ASPART 100 UNIT/ML IJ SOLN
9.0000 [IU] | Freq: Three times a day (TID) | INTRAMUSCULAR | Status: DC
  Administered 2022-10-28 – 2022-10-29 (×2): 9 [IU] via SUBCUTANEOUS

## 2022-10-28 MED ORDER — INSULIN ASPART 100 UNIT/ML IJ SOLN
8.0000 [IU] | Freq: Once | INTRAMUSCULAR | Status: AC
  Administered 2022-10-28: 8 [IU] via SUBCUTANEOUS

## 2022-10-28 NOTE — Progress Notes (Signed)
   10/28/22 2302  Psych Admission Type (Psych Patients Only)  Admission Status Voluntary  Psychosocial Assessment  Patient Complaints Sleep disturbance  Eye Contact Fair  Facial Expression Animated  Affect Appropriate to circumstance  Speech Logical/coherent  Interaction Assertive  Motor Activity Other (Comment) (WDL)  Appearance/Hygiene Unremarkable  Behavior Characteristics Appropriate to situation  Mood Pleasant  Thought Process  Coherency WDL  Content WDL  Delusions None reported or observed  Perception WDL  Hallucination None reported or observed  Judgment WDL  Confusion None  Danger to Self  Current suicidal ideation? Denies  Agreement Not to Harm Self Yes  Description of Agreement verbal  Danger to Others  Danger to Others None reported or observed

## 2022-10-28 NOTE — Group Note (Signed)
Date:  10/28/2022 Time:  6:09 PM  Group Topic/Focus:  Support and Check-in-Open Discussion and Journaling    Participation Level:  Active  Participation Quality:  Appropriate  Affect:  Appropriate  Cognitive:  Appropriate  Insight: Appropriate  Engagement in Group:  Engaged  Modes of Intervention:  Activity, Discussion, and Support  Additional Comments:   Pt attended and participated in the Support Check in, journaling and open discussion group.  Kathryn Gilbert 10/28/2022, 6:09 PM

## 2022-10-28 NOTE — Plan of Care (Signed)
  Problem: Coping: Goal: Ability to adjust to condition or change in health will improve Outcome: Progressing   Problem: Metabolic: Goal: Ability to maintain appropriate glucose levels will improve Outcome: Progressing   Problem: Coping: Goal: Ability to verbalize frustrations and anger appropriately will improve Outcome: Progressing

## 2022-10-28 NOTE — [facility] Suicide Risk Assessment (Signed)
[facility] INPATIENT:  Family/Significant Other Suicide Prevention Education  Suicide Prevention Education:  Education Completed; Engineer, materials,  (name of family member/significant other) has been identified by the patient as the family member/significant other with whom the patient will be residing, and identified as the person(s) who will aid the patient in the event of a mental health crisis (suicidal ideations/suicide attempt).  With written consent from the patient, the family member/significant other has been provided the following suicide prevention education, prior to the and/or following the discharge of the patient.  The suicide prevention education provided includes the following: Suicide risk factors Suicide prevention and interventions National Suicide Hotline telephone number [facility] assessment telephone number [facility] Emergency Assistance 911 [facility] and/or Residential Mobile Crisis Unit telephone number  Request made of family/significant other to: Remove weapons (e.g., guns, rifles, knives), all items previously/currently identified as safety concern.   Remove drugs/medications (over-the-counter, prescriptions, illicit drugs), all items previously/currently identified as a safety concern.  The family member/significant other verbalizes understanding of the suicide prevention education information provided.  The family member/significant other agrees to remove the items of safety concern listed above.  [address], [place] 10/28/2022, 1:41 PM

## 2022-10-28 NOTE — Progress Notes (Signed)
[facility] MD Progress Note  10/28/2022 10:08 AM Kathryn Gilbert  MRN:  [id number]  Subjective:   32 year old Caucasian female with hx of opioid use disorder, major depressive disorder & benzodiazepine use disorder. Admitted to the [facility] from the [facility] with complaint of suicide attempt on 15 tablets of Benadryl 25 mg per tablet. After the attempt, patient's husband apparently called 911 & was transported to the hospital for treatment. On arrival to the hospital ED, patient was taken to the ICU where she received treatments (IV insulin infusion) for hyperglycemic episode that placed patinet her in a comatose state. After medical evaluation, treatments & stabilization, Rox was recommended & transferred to the [facility] for further psychiatric evaluation/treatments.   On my evaluation today, the patient reports that her mood is significantly less depressed.  Reports anxiety level remains elevated.  Reports anxiety is worse than depression.  I discussed with her that her Prozac dose was not increased for this morning, as the patient and I, and as the nurse practitioner and I had discussed, and also the gabapentin dose was not increased either.  We will increase the Prozac to 30 mg today, by adding an additional 10 mg capsule to 20 mg as she already took.  Patient is understanding of this.  Patient reports that overall sleep is better, but that her roommate snores.  Denies any other sleep issues outside of roommate snoring.  Reports appetite is better.  Concentration is okay.  Denies any SI or HI.  Denies any psychotic symptoms.  Denies any side effects to current psychiatric medications.  Patient states that safety planning is yet to be done with her husband, and I followed up with the social worker about this, and the social worker states that the had declined safety planning, but will reapproach the patient in regards to this.  We discussed the discharge could be as early as tomorrow 6/9.     Principal  Problem: MDD (major depressive disorder) Diagnosis: Principal Problem:   MDD (major depressive disorder) Active Problems:   Severe benzodiazepine use disorder (HCC)   Opioid use disorder, severe, in controlled environment (HCC)  Total Time spent with patient: 20 minutes  Past Psychiatric History: See H&P  Past Medical History:  Past Medical History:  Diagnosis Date   Diabetes mellitus without complication (HCC)    Type 1   Herpes simplex virus type 1 (HSV-1) dermatitis    Hx of varicella    Kidney stones    age 48    Past Surgical History:  Procedure Laterality Date   CESAREAN SECTION     Family History:  Family History  Problem Relation Age of Onset   Hypertension Mother    Cancer Mother        skin   Hypertension Father    Family Psychiatric  History: See H&P  Social History:  Social History   Substance and Sexual Activity  Alcohol Use Not Currently     Social History   Substance and Sexual Activity  Drug Use Yes   Types: Oxycodone   Comment: reports regular use of illicit Rx opiates    Social History   Socioeconomic History   Marital status: Married    Spouse name: Not on file   Number of children: 3   Years of education: Not on file   Highest education level: Not on file  Occupational History   Not on file  Tobacco Use   Smoking status: Every Day    Packs/day: 1.50  Years: 15.00    Additional pack years: 0.00    Total pack years: 22.50    Types: Cigarettes    Last attempt to quit: 01/23/2012    Years since quitting: 10.7    Passive exposure: Past   Smokeless tobacco: Never  Vaping Use   Vaping Use: Never used  Substance and Sexual Activity   Alcohol use: Not Currently   Drug use: Yes    Types: Oxycodone    Comment: reports regular use of illicit Rx opiates   Sexual activity: Yes  Other Topics Concern   Not on file  Social History Narrative   Not on file   Social Determinants of Health   Financial Resource Strain: Not on file   Food Insecurity: No Food Insecurity (10/25/2022)   Hunger Vital Sign    Worried About Running Out of Food in the Last Year: Never true    Ran Out of Food in the Last Year: Never true  Transportation Needs: No Transportation Needs (10/25/2022)   PRAPARE - Administrator, Civil Service (Medical): No    Lack of Transportation (Non-Medical): No  Physical Activity: Not on file  Stress: Not on file  Social Connections: Not on file   Additional Social History:                           Current Medications: Current Facility-Administered Medications  Medication Dose Route Frequency Provider Last Rate Last Admin   acetaminophen (TYLENOL) tablet 650 mg  650 mg Oral Q6H PRN Oneta Rack, NP   650 mg at 10/28/22 0625   alum & mag hydroxide-simeth (MAALOX/MYLANTA) 200-200-20 MG/5ML suspension 30 mL  30 mL Oral Q4H PRN Oneta Rack, NP       buPROPion ER [facility] SR) 12 hr tablet 100 mg  100 mg Oral Daily Pa Tennant, Harrold Donath, MD   100 mg at 10/28/22 0743   cloNIDine (CATAPRES) tablet 0.1 mg  0.1 mg Oral Q4H PRN Ryeleigh Santore, Harrold Donath, MD   0.1 mg at 10/28/22 0941   dicyclomine (BENTYL) tablet 20 mg  20 mg Oral Q6H PRN Phineas Inches, MD       Melene Muller ON 10/29/2022] FLUoxetine (PROZAC) capsule 40 mg  40 mg Oral Daily Ardene Remley, MD       gabapentin (NEURONTIN) capsule 300 mg  300 mg Oral Q8H Yosgart Pavey, MD       hydrOXYzine (ATARAX) tablet 25 mg  25 mg Oral TID PRN Onuoha, Chinwendu V, NP   25 mg at 10/28/22 0748   insulin aspart (novoLOG) injection 0-5 Units  0-5 Units Subcutaneous QHS Oneta Rack, NP   3 Units at 10/27/22 2125   insulin aspart (novoLOG) injection 0-9 Units  0-9 Units Subcutaneous TID WC Oneta Rack, NP   5 Units at 10/28/22 0629   insulin aspart (novoLOG) injection 8 Units  8 Units Subcutaneous TID WC Deserai Cansler, Harrold Donath, MD   8 Units at 10/28/22 0625   insulin detemir (LEVEMIR) injection 11 Units  11 Units Subcutaneous Q12H Phineas Inches, MD   11 Units at 10/28/22 0748   lisinopril (ZESTRIL) tablet 5 mg  5 mg Oral Daily Armandina Stammer I, NP   5 mg at 10/28/22 0743   loperamide (IMODIUM) capsule 2-4 mg  2-4 mg Oral PRN Nykole Matos, Harrold Donath, MD       magnesium hydroxide (MILK OF MAGNESIA) suspension 30 mL  30 mL Oral Daily PRN Oneta Rack, NP  methocarbamol (ROBAXIN) tablet 500 mg  500 mg Oral Q8H PRN Penny Arrambide, MD       nicotine polacrilex (NICORETTE) gum 2 mg  2 mg Oral PRN Edlin Ford, Harrold Donath, MD   2 mg at 10/28/22 0743   ondansetron (ZOFRAN-ODT) disintegrating tablet 4 mg  4 mg Oral Q6H PRN Giann Obara, Harrold Donath, MD   4 mg at 10/26/22 0804   traZODone (DESYREL) tablet 50 mg  50 mg Oral QHS PRN Oneta Rack, NP   50 mg at 10/27/22 2122    Lab Results:  Results for orders placed or performed during the hospital encounter of 10/25/22 (from the past 48 hour(s))  Glucose, capillary     Status: Abnormal   Collection Time: 10/26/22 11:49 AM  Result Value Ref Range   Glucose-Capillary 359 (H) 70 - 99 mg/dL    Comment: Glucose reference range applies only to samples taken after fasting for at least 8 hours.  Glucose, capillary     Status: Abnormal   Collection Time: 10/26/22  5:11 PM  Result Value Ref Range   Glucose-Capillary 268 (H) 70 - 99 mg/dL    Comment: Glucose reference range applies only to samples taken after fasting for at least 8 hours.  Glucose, capillary     Status: Abnormal   Collection Time: 10/26/22  8:50 PM  Result Value Ref Range   Glucose-Capillary 247 (H) 70 - 99 mg/dL    Comment: Glucose reference range applies only to samples taken after fasting for at least 8 hours.   Comment 1 Notify RN    Comment 2 Document in Chart   Glucose, capillary     Status: None   Collection Time: 10/27/22  5:46 AM  Result Value Ref Range   Glucose-Capillary 91 70 - 99 mg/dL    Comment: Glucose reference range applies only to samples taken after fasting for at least 8 hours.  Glucose, capillary      Status: Abnormal   Collection Time: 10/27/22 11:58 AM  Result Value Ref Range   Glucose-Capillary 428 (H) 70 - 99 mg/dL    Comment: Glucose reference range applies only to samples taken after fasting for at least 8 hours.  Glucose, capillary     Status: Abnormal   Collection Time: 10/27/22  5:02 PM  Result Value Ref Range   Glucose-Capillary 252 (H) 70 - 99 mg/dL    Comment: Glucose reference range applies only to samples taken after fasting for at least 8 hours.  Glucose, capillary     Status: Abnormal   Collection Time: 10/27/22  9:01 PM  Result Value Ref Range   Glucose-Capillary 236 (H) 70 - 99 mg/dL    Comment: Glucose reference range applies only to samples taken after fasting for at least 8 hours.  Glucose, capillary     Status: Abnormal   Collection Time: 10/28/22  6:11 AM  Result Value Ref Range   Glucose-Capillary 294 (H) 70 - 99 mg/dL    Comment: Glucose reference range applies only to samples taken after fasting for at least 8 hours.    Blood Alcohol level:  Lab Results  Component Value Date   ETH 246 (H) 07/30/2011    Metabolic Disorder Labs: Lab Results  Component Value Date   HGBA1C 9.6 (H) 10/25/2022   MPG 229 10/25/2022   No results found for: "PROLACTIN" No results found for: "CHOL", "TRIG", "HDL", "CHOLHDL", "VLDL", "LDLCALC"  Physical Findings: AIMS:  , ,  ,  ,    CIWA:  COWS:  COWS Total Score: 5  Musculoskeletal: Strength & Muscle Tone: within normal limits Gait & Station: normal Patient leans: N/A  Psychiatric Specialty Exam:  Presentation  General Appearance:  Casual; Fairly Groomed  Eye Contact: Good  Speech: Normal Rate  Speech Volume: Normal  Handedness: Right   Mood and Affect  Mood: Dysphoric; Anxious  Affect: Appropriate; Congruent; Full Range   Thought Process  Thought Processes: Linear  Descriptions of Associations:Intact  Orientation:Full (Time, Place and Person)  Thought Content:Logical  History of  Schizophrenia/Schizoaffective disorder:No data recorded Duration of Psychotic Symptoms:No data recorded Hallucinations:Hallucinations: None  Ideas of Reference:None  Suicidal Thoughts:Suicidal Thoughts: No  Homicidal Thoughts:Homicidal Thoughts: No   Sensorium  Memory: Immediate Good; Recent Good; Remote Good  Judgment: Fair  Insight: Fair   Art therapist  Concentration: Fair  Attention Span: Fair  Recall: Good  Fund of Knowledge: Good  Language: Good   Psychomotor Activity  Psychomotor Activity: Psychomotor Activity: Normal   Assets  Assets: Communication Skills; Desire for Improvement; Financial Resources/Insurance; Housing; Resilience; Social Support   Sleep  Sleep: Sleep: Fair Number of Hours of Sleep: 8    Physical Exam: Physical Exam Vitals reviewed.  Constitutional:      General: She is not in acute distress.    Appearance: She is normal weight. She is not toxic-appearing.  Pulmonary:     Effort: Pulmonary effort is normal.  Neurological:     Mental Status: She is alert.     Motor: No weakness.     Gait: Gait normal.  Psychiatric:        Behavior: Behavior normal.        Thought Content: Thought content normal.        Judgment: Judgment normal.    Review of Systems  Constitutional:  Negative for chills and fever.  Cardiovascular:  Negative for chest pain and palpitations.  Neurological:  Negative for dizziness, tingling, tremors and headaches.  Psychiatric/Behavioral:  Positive for depression. Negative for hallucinations, memory loss, substance abuse and suicidal ideas. The patient is nervous/anxious. The patient does not have insomnia.   All other systems reviewed and are negative.  Blood pressure 139/80, pulse 72, temperature 98.2 F (36.8 C), temperature source Oral, resp. rate 16, height 5\' 4"  (1.626 m), weight 62.6 kg, last menstrual period 10/16/2022, SpO2 100 %, not currently breastfeeding. Body mass index is 23.69  kg/m.   Treatment Plan Summary: Daily contact with patient to assess and evaluate symptoms and progress in treatment and Medication management  Principal/active diagnoses.  Major depressive disorder, recurrent episodes.  Opioid use disorder, chronic.  Benzodiazepine use disorder.    Medical conditions. Diabetes mellitus (insulin dependent).    Plan: The risks/benefits/side-effects/alternatives to the medications in use were discussed in detail with the patient and time was given for patient's questions. The patient consents to medication trial.    Previously discontinued on admission Wellbutrin XL 150 mg.  Continue Wellbutrin SR 100 mg po daily for depression.  Previously discontinued on admission sertraline 100 mg. Increase Prozac from 30 mg to 40 mg once daily for depression. Continue Hydroxyzine 25 mg po tid prn for anxiety.  Continue Trazodone 50 mg po Q hs prn for insomnia.  Continue Nicoderm patch 14 mg topically Q 24 hrs for nicotine withdrawal.  Increase gabapentin from 200 mg po tid to 300 mg tid - for anxiety and substance withdrawal syndrome.   Opioid withdrawal-      Continue Clonidine 0.1 mg po Q 4 hrs prn for  withdrawal management.  Continue Bentyl 20 mg po Q 6 hrs prn for abdominal spasms/cramps. Continue Imodium 2-4 mg po prn for diarrhea,  Continue Robaxin 500 mg Q 8 hrs prn for muscle spasms.  Continue Naproxen 500 mg po bid prn for pain. Continue Zofran-ODT 4 mg po Q 6 hrs prn for N/V.    Other medical issues.  Increased Novolog insulin to 8 units Subq tid with meals meal coverage. Changed Levemir insulin from 22 units subq Q hs to 11 units bid for DM. Continue the sliding scale insulin coverage as recommended. Completed Lisinopril 10 mg po once today for consistent elevated blood pressure. Continue Lisinopril 5 mg po daily x 4 days starting tomorrow.   Other PRNS -Continue Tylenol 650 mg every 6 hours PRN for mild pain -Continue Maalox 30 ml Q 4 hrs PRN  for indigestion -Continue MOM 30 ml po Q 6 hrs for constipation   Safety and Monitoring: Voluntary admission to inpatient psychiatric unit for safety, stabilization and treatment Daily contact with patient to assess and evaluate symptoms and progress in treatment Patient's case to be discussed in multi-disciplinary team meeting Observation Level : q15 minute checks Vital signs: q12 hours Precautions: Safety   Discharge Planning: Social work and case management to assist with discharge planning and identification of hospital follow-up needs prior to discharge Estimated LOS: Discharge planning for tomorrow 6/9 Discharge Concerns: Need to establish a safety plan; Medication compliance and effectiveness Discharge Goals: Return home with outpatient referrals for mental health follow-up including medication management/psychotherapy  Cristy Hilts, MD 10/28/2022, 10:08 AM  Total Time Spent in Direct Patient Care:  I personally spent 35 minutes on the unit in direct patient care. The direct patient care time included face-to-face time with the patient, reviewing the patient's chart, communicating with other professionals, and coordinating care. Greater than 50% of this time was spent in counseling or coordinating care with the patient regarding goals of hospitalization, psycho-education, and discharge planning needs.   Phineas Inches, MD Psychiatrist

## 2022-10-28 NOTE — Progress Notes (Signed)
   10/27/22 2123  Psych Admission Type (Psych Patients Only)  Admission Status Voluntary  Psychosocial Assessment  Patient Complaints Anxiety;Depression  Eye Contact Fair  Facial Expression Anxious;Sad  Affect Appropriate to circumstance  Speech Logical/coherent  Interaction Assertive  Motor Activity Other (Comment)  Appearance/Hygiene Unremarkable  Behavior Characteristics Cooperative;Appropriate to situation  Mood Depressed;Anxious  Thought Process  Coherency WDL  Content WDL  Delusions None reported or observed  Perception WDL  Hallucination None reported or observed  Judgment Impaired  Confusion None  Danger to Self  Current suicidal ideation? Denies  Agreement Not to Harm Self Yes  Description of Agreement Verbal  Danger to Others  Danger to Others None reported or observed

## 2022-10-28 NOTE — Group Note (Signed)
Date:  10/28/2022 Time:  9:47 PM  Participation Level:  Active  Participation Quality:  Appropriate and Attentive  Affect:  Appropriate  Cognitive:  Alert and Appropriate  Insight: Appropriate and Good  Engagement in Group:  Engaged  Modes of Intervention:  Discussion and Education  Additional Comments:  Patient attended and participated in wrap up group and rated their day a 10/10, due to them D/C tomorrow. Pt states that once they return home they will continue to take their med's, use their coping skills and get out of the house more to keep their stress low.   Kathryn Gilbert 10/28/2022, 9:47 PM

## 2022-10-28 NOTE — Progress Notes (Signed)
   10/28/22 0602  15 Minute Checks  Location Bedroom  Visual Appearance Calm  Behavior Sleeping  Sleep (Behavioral Health Patients Only)  Calculate sleep? (Click Yes once per 24 hr at 0600 safety check) Yes  Documented sleep last 24 hours 7.75

## 2022-10-28 NOTE — Progress Notes (Addendum)
D. Pt presented anxious with complaints of mild withdrawal symptoms (anxiety, irritability, restlessness), and toothache. Per pt's self inventory, pt rated her depression,hopelessness and anxiety a 3/0/6, respectively. Pt reported that she was looking forward to being discharged tomorrow, and that she was missing her kids. Pt has been visible in the milieu, observed interacting well with peers and attending groups. Pt currently denies SI/HI and AVH A. Labs and vitals monitored. Pt given and educated on medications. Pt supported emotionally and encouraged to express concerns and ask questions.   R. Pt remains safe with 15 minute checks. Will continue POC.    10/28/22 0900  Psych Admission Type (Psych Patients Only)  Admission Status Voluntary  Psychosocial Assessment  Patient Complaints Anxiety  Eye Contact Fair  Facial Expression Anxious  Affect Appropriate to circumstance  Speech Logical/coherent  Interaction Assertive  Motor Activity Other (Comment) (level 3 observation)  Appearance/Hygiene Unremarkable  Behavior Characteristics Cooperative;Appropriate to situation  Mood Anxious;Pleasant  Thought Process  Coherency WDL  Content WDL  Delusions None reported or observed  Perception WDL  Hallucination None reported or observed  Judgment Impaired  Confusion None  Danger to Self  Current suicidal ideation? Denies  Agreement Not to Harm Self Yes  Description of Agreement verbal contract for safety  Danger to Others  Danger to Others None reported or observed

## 2022-10-28 NOTE — Inpatient Diabetes Management (Signed)
DM1Inpatient Diabetes Program Recommendations  AACE/ADA: New Consensus Statement on Inpatient Glycemic Control (2015)  Target Ranges:  Prepandial:   less than 140 mg/dL      Peak postprandial:   less than 180 mg/dL (1-2 hours)      Critically ill patients:  140 - 180 mg/dL   Lab Results  Component Value Date   GLUCAP 436 (H) 10/28/2022   HGBA1C 9.6 (H) 10/25/2022    Review of Glycemic Control  Diabetes history: DM1 Outpatient Diabetes medications: Insulin pump Current orders for Inpatient glycemic control: Levemir 11 units BID, Novolog 0-15 TID with meals and 0-5 HS + 8 units TID  Inject 1.6 Units into the skin continuous. Insulin pump  1.6 units/hour  1 unit for every 10 gram of carbohydrates  1 unit for every 40 >150 sliding scale   Inpatient Diabetes Program Recommendations:    Increase Levemir to 12 units BID  Increase Novolog to 9 units TID with meals if eating > 50%  RN - please encourage pt to make healthy choices using portion control in cafeteria. Please decrease high-sugar beverages and replace with diet soda, unsweetened coffee and tea.  Continue to follow.  Thank you. Ailene Ards, RD, LDN, CDCES Inpatient Diabetes Coordinator [phone]

## 2022-10-28 NOTE — Group Note (Signed)
Date:  10/28/2022 Time:  4:41 PM  Group Topic/Focus:  Dimensions of Wellness:   The focus of this group is to introduce the topic of wellness and discuss the role each dimension of wellness plays in total health.    Participation Level:  Active  Participation Quality:  Appropriate  Affect:  Appropriate  Cognitive:  Appropriate  Insight: Appropriate  Engagement in Group:  Engaged  Modes of Intervention:  Activity and Discussion  Additional Comments:   Pt attended and participated in the Social Wellness Group.  Kathryn Gilbert 10/28/2022, 4:41 PM

## 2022-10-28 NOTE — Group Note (Signed)
Date:  10/28/2022 Time:  4:20 PM  Group Topic/Focus:  Goals Group:   The focus of this group is to help patients establish daily goals to achieve during treatment and discuss how the patient can incorporate goal setting into their daily lives to aide in recovery. Orientation:   The focus of this group is to educate the patient on the purpose and policies of crisis stabilization and provide a format to answer questions about their admission.  The group details unit policies and expectations of patients while admitted.    Participation Level:  Active  Participation Quality:  Appropriate  Affect:  Appropriate  Cognitive:  Appropriate  Insight: Appropriate  Engagement in Group:  Engaged  Modes of Intervention:  Activity, Discussion, and Orientation  Additional Comments:   Pt attended and participated in the Orientation/Goals Group.  Kathryn Gilbert 10/28/2022, 4:20 PM

## 2022-10-29 ENCOUNTER — Encounter (HOSPITAL_COMMUNITY): Payer: Self-pay | Admitting: Psychiatry

## 2022-10-29 LAB — GLUCOSE, CAPILLARY
Glucose-Capillary: 152 mg/dL — ABNORMAL HIGH (ref 70–99)
Glucose-Capillary: 368 mg/dL — ABNORMAL HIGH (ref 70–99)

## 2022-10-29 MED ORDER — TRAZODONE HCL 50 MG PO TABS: 50.0000 mg | ORAL_TABLET | Freq: Every evening | ORAL | 0 refills | Status: AC | PRN

## 2022-10-29 MED ORDER — HYDROXYZINE HCL 25 MG PO TABS: 25.0000 mg | ORAL_TABLET | Freq: Three times a day (TID) | ORAL | 0 refills | Status: AC | PRN

## 2022-10-29 MED ORDER — LISINOPRIL 5 MG PO TABS: 5.0000 mg | ORAL_TABLET | Freq: Every day | ORAL | 0 refills | Status: AC

## 2022-10-29 MED ORDER — BUPROPION HCL ER (SR) 100 MG PO TB12: 100.0000 mg | ORAL_TABLET | Freq: Every day | ORAL | 0 refills | Status: AC

## 2022-10-29 MED ORDER — ALUM & MAG HYDROXIDE-SIMETH 200-200-20 MG/5ML PO SUSP
30.0000 mL | Freq: Once | ORAL | Status: AC
  Administered 2022-10-29: 30 mL via ORAL
  Filled 2022-10-29: qty 30

## 2022-10-29 MED ORDER — INSULIN ASPART 100 UNIT/ML IJ SOLN: 1.8000 [IU] | INTRAMUSCULAR | 2 refills | Status: AC

## 2022-10-29 MED ORDER — GABAPENTIN 300 MG PO CAPS: 300.0000 mg | ORAL_CAPSULE | Freq: Three times a day (TID) | ORAL | 0 refills | Status: AC

## 2022-10-29 MED ORDER — FLUOXETINE HCL 40 MG PO CAPS: 40.0000 mg | ORAL_CAPSULE | Freq: Every day | ORAL | 0 refills | Status: AC

## 2022-10-29 MED ORDER — NICOTINE POLACRILEX 2 MG MT GUM: 2.0000 mg | CHEWING_GUM | OROMUCOSAL | 0 refills | Status: AC | PRN

## 2022-10-29 NOTE — Discharge Instructions (Signed)
-  Follow-up with your outpatient psychiatric provider -instructions on appointment date, time, and address (location) are provided to you in discharge paperwork.  -Take your psychiatric medications as prescribed at discharge - instructions are provided to you in the discharge paperwork  -Follow-up with outpatient primary care doctor and other specialists -for management of preventative medicine and any chronic medical disease.  -Recommend abstinence from alcohol, tobacco, and other illicit drug use at discharge.   -If your psychiatric symptoms recur, worsen, or if you have side effects to your psychiatric medications, call your outpatient psychiatric provider, 911, [phone] or go to the nearest emergency department.  -If suicidal thoughts occur, call your outpatient psychiatric provider, 911, [phone] or go to the nearest emergency department.  Naloxone (Narcan) can help reverse an overdose when given to the victim quickly.  [place] offers free naloxone kits and instructions/training on its use.  Add naloxone to your first aid kit and you can help save a life.   Pick up your free kit at the following locations:   [place]:  [facility], [address] [place] [address] ([phone]) [facility] [address] [place] [id number] ([phone]) [facility] [address] [place] [address]  [place]: [facility] [address] [place] [address] ([phone]) [facility] [address] [place] [address] ([phone])  

## 2022-10-29 NOTE — Progress Notes (Addendum)
D. Pt has been appropriate on the unit- friendly- looking forward to discharging today. Pt has been visible in the milieu interacting well with peers and staff, and observed attending groups.  Pt denies SI/HI and AVH .  A. Labs and vitals monitored. Pt given and educated on medications. Pt supported emotionally and encouraged to express concerns and ask questions.   R. Pt remains safe with 15 minute checks. Will continue POC.    10/29/22 0900  Psych Admission Type (Psych Patients Only)  Admission Status Voluntary  Psychosocial Assessment  Patient Complaints Anxiety  Eye Contact Fair  Facial Expression Animated  Affect Appropriate to circumstance  Speech Logical/coherent  Interaction Assertive  Motor Activity Other (Comment) (level 3 observation)  Appearance/Hygiene Unremarkable  Behavior Characteristics Cooperative;Appropriate to situation  Mood Pleasant;Anxious  Thought Process  Coherency WDL  Content WDL  Delusions None reported or observed  Perception WDL  Hallucination None reported or observed  Judgment WDL  Confusion None  Danger to Self  Current suicidal ideation? Denies  Agreement Not to Harm Self Yes  Description of Agreement verbal contract for safety  Danger to Others  Danger to Others None reported or observed

## 2022-10-29 NOTE — [facility] Suicide Risk Assessment (Signed)
[facility] Discharge Suicide Risk Assessment   Principal Problem: MDD (major depressive disorder) Discharge Diagnoses: Principal Problem:   MDD (major depressive disorder) Active Problems:   Severe benzodiazepine use disorder (HCC)   Opioid use disorder, severe, in controlled environment (HCC)   Total Time spent with patient: 20 minutes  This is the first psychiatric admission in this [facility] for this 32 year old Caucasian female with hx of opioid use disorder, major depressive disorder & benzodiazepine use disorder. Admitted to the [facility] from the [facility] with complaint of suicide attempt on 15 tablets of Benadryl 25 mg per tablet. After the attempt, patient's husband apparently called 911 & was transported to the hospital for treatment. On arrival to the hospital ED, patient was taken to the ICU where she received treatments (IV insulin infusion) for hyperglycemic episode that placed patinet her in a comatose state. After medical evaluation, treatments & stabilization, Kathryn Gilbert was recommended & transferred to the [facility] for further psychiatric evaluation/treatments.   During the patient's hospitalization, patient had extensive initial psychiatric evaluation, and follow-up psychiatric evaluations every day.   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  Opioid use disorder  Benzodiazepine use disorder.    Patient's psychiatric medications were adjusted on admission:  Discontinued Wellbutrin XL 150 mg.  Initiated Wellbutrin SR 100 mg po daily for depression.  Discontinued Sertraline 100 mg. Initiated Prozac 20 mg po daily for depression. Continue Hydroxyzine 25 mg po tid prn for anxiety.  Continue Trazodone 50 mg po Q hs prn for insomnia.  Continue Nicoderm patch 14 mg topically Q 24 hrs for Continue Clonidine 0.1 mg po Q 4 hrs prn for opioid withdrawal management.     During the hospitalization, other adjustments  were made to the patient's psychiatric medication regimen:  -prozac was increased to 40 mg once daily -gabapentin was started and increased to 300 mg tid    Patient's care was discussed during the interdisciplinary team meeting every day during the hospitalization.   The patient denied having side effects to prescribed psychiatric medication.   Gradually, patient started adjusting to milieu. The patient was evaluated each day by a clinical provider to ascertain response to treatment. Improvement was noted by the patient's report of decreasing symptoms, improved sleep and appetite, affect, medication tolerance, behavior, and participation in unit programming.  Patient was asked each day to complete a self inventory noting mood, mental status, pain, new symptoms, anxiety and concerns.     Symptoms were reported as significantly decreased or resolved completely by discharge.    On day of discharge, the patient reports that their mood is stable. The patient denied having suicidal thoughts for more than 48 hours prior to discharge.  Patient denies having homicidal thoughts.  Patient denies having auditory hallucinations.  Patient denies any visual hallucinations or other symptoms of psychosis. The patient was motivated to continue taking medication with a goal of continued improvement in mental health.    The patient reports their target psychiatric symptoms of depression and suicidal thoughts, all responded well to the psychiatric medications, and the patient reports overall benefit other psychiatric hospitalization. Supportive psychotherapy was provided to the patient. The patient also participated in regular group therapy while hospitalized. Coping skills, problem solving as well as relaxation therapies were also part of the unit programming.   Labs were reviewed with the patient, and abnormal results were discussed with the patient.   The patient is able to verbalize their individual safety  plan to  this provider.   # It is recommended to the patient to continue psychiatric medications as prescribed, after discharge from the hospital.     # It is recommended to the patient to follow up with your outpatient psychiatric provider and PCP.   # It was discussed with the patient, the impact of alcohol, drugs, tobacco have been there overall psychiatric and medical wellbeing, and total abstinence from substance use was recommended the patient.ed.   # Prescriptions provided or sent directly to preferred pharmacy at discharge. Patient agreeable to plan. Given opportunity to ask questions. Appears to feel comfortable with discharge.    # In the event of worsening symptoms, the patient is instructed to call the crisis hotline, 911 and or go to the nearest ED for appropriate evaluation and treatment of symptoms. To follow-up with primary care provider for other medical issues, concerns and or health care needs     Psychiatric Specialty Exam  Presentation  General Appearance:  Appropriate for Environment; Casual; Fairly Groomed  Eye Contact: Good  Speech: Normal Rate; Clear and Coherent  Speech Volume: Normal  Handedness: Right   Mood and Affect  Mood: Euthymic  Duration of Depression Symptoms: No data recorded Affect: Appropriate; Congruent; Full Range   Thought Process  Thought Processes: Linear  Descriptions of Associations:Intact  Orientation:Full (Time, Place and Person)  Thought Content:Logical  History of Schizophrenia/Schizoaffective disorder:No data recorded Duration of Psychotic Symptoms:No data recorded Hallucinations:Hallucinations: None  Ideas of Reference:None  Suicidal Thoughts:Suicidal Thoughts: No  Homicidal Thoughts:Homicidal Thoughts: No   Sensorium  Memory: Immediate Good; Recent Good; Remote Good  Judgment: Good  Insight: Good   Executive Functions  Concentration: Good  Attention Span: Good  Recall: Good  Fund of  Knowledge: Good  Language: Good   Psychomotor Activity  Psychomotor Activity: Psychomotor Activity: Normal   Assets  Assets: Communication Skills; Desire for Improvement; Financial Resources/Insurance; Housing; Resilience; Social Support   Sleep  Sleep: Sleep: Fair   Physical Exam: Physical Exam See discharge summary  ROS See discharge summary  Blood pressure 131/85, pulse 79, temperature 98.4 F (36.9 C), temperature source Oral, resp. rate 16, height 5\' 4"  (1.626 m), weight 62.6 kg, last menstrual period 10/16/2022, SpO2 100 %, not currently breastfeeding. Body mass index is 23.69 kg/m.  Mental Status Per Nursing Assessment::   On Admission:  Suicidal ideation indicated by patient  Demographic factors:  NA   Loss Factors:  Financial problems / change in socioeconomic status   Historical Factors:  Prior suicide attempts, Family history of suicide   Risk Reduction Factors:  Positive social support, Sense of responsibility to family  Continued Clinical Symptoms:  Mood is stable. Denying SI.   Cognitive Features That Contribute To Risk:  None    Suicide Risk:  Mild:  There are no identifiable suicide plans, no associated intent, mild dysphoria and related symptoms, good self-control (both objective and subjective assessment), few other risk factors, and identifiable protective factors, including available and accessible social support.    Follow-up Information     [facility] Follow up on 10/30/2022.   Why: You have an appointment for an assessment screening on 10/30/22 at 3:00 pm.  This will be a Virtual teleheath appt. Contact information: [address], [place], [place] [address]  Phone: [phone] Fax: [phone]                Plan Of Care/Follow-up recommendations:   -Follow-up with  your outpatient psychiatric provider -instructions on appointment date, time, and address (location) are  provided to you in discharge paperwork.   -Take your psychiatric medications as prescribed at discharge - instructions are provided to you in the discharge paperwork   -Follow-up with outpatient primary care doctor and other specialists -for management of preventative medicine and chronic medical disease   -If you are prescribed an atypical antipsychotic medication, we recommend that your outpatient psychiatrist follow routine screening for side effects within 3 months of discharge, including monitoring: AIMS scale, height, weight, blood pressure, fasting lipid panel, HbA1c, and fasting blood sugar.    -Recommend total abstinence from alcohol, tobacco, and other illicit drug use at discharge.    -If your psychiatric symptoms recur, worsen, or if you have side effects to your psychiatric medications, call your outpatient psychiatric provider, 911, [phone] or go to the nearest emergency department.   -If suicidal thoughts occur, immediately call your outpatient psychiatric provider, 911, 988 or go to the nearest emergency department.   Cristy Hilts, MD 10/29/2022, 8:20 AM

## 2022-10-29 NOTE — Progress Notes (Signed)
?   [facility] Adult Case Management Discharge Plan :  Will you be returning to the same living situation after discharge:  Yes,  Patient discharge home with spouse At discharge, do you have transportation home?: Yes,  Family friend will provide transport home Do you have the ability to pay for your medications: Yes,  Patient has insurance to cover medication costs  Release of information consent forms completed and in the chart;  Patient's signature needed at discharge.  Patient to Follow up at:  Follow-up Information     [facility] Follow up on 10/30/2022.   Why: You have an appointment for an assessment screening on 10/30/22 at 3:00 pm.  This will be a Virtual teleheath appt. Contact information: [address], [place], [place] [address]  Phone: [phone] Fax: [phone]                Next level of care provider has access to [facility] Link:no  Safety Planning and Suicide Prevention discussed: Yes,  completed with spouse      Has patient been referred to the Quitline?: Yes, faxed/e-referral on 10/29/22  Patient has been referred for addiction treatment: Yes, the patient will follow up with an outpatient provider for substance use disorder. Therapist: appointment made  Verna [place], [place] 10/29/2022, 10:28 AM

## 2022-10-29 NOTE — Progress Notes (Signed)
Pt discharged to lobby. Pt was stable and appreciative at that time. All papers and electronic prescriptions were given and valuables returned. Suicide safety plan completed. Verbal understanding expressed. Denies SI/HI and A/VH. Pt given opportunity to express concerns and ask questions. 

## 2022-10-29 NOTE — Discharge Summary (Signed)
Physician Discharge Summary Note  Patient:  Kathryn Gilbert is an 32 y.o., female MRN:  [id number] DOB:  1991/03/06 Patient phone:  [phone] (home)  Patient address:   [address] [place] [address],  Total Time spent with patient: 20 minutes  Date of Admission:  10/25/2022 Date of Discharge: 10-29-2022  Reason for Admission:   This is the first psychiatric admission in this [facility] for this 32 year old Caucasian female with hx of opioid use disorder, major depressive disorder & benzodiazepine use disorder. Ad[facility] to the [facility] from the [facility] with complaint of suicide attempt on 15 tablets of Benadryl 25 mg per tablet. After the attempt, patient's husband apparently called 911 & was transported to the hospital for treatment. On arrival to the hospital ED, patient was taken to the ICU where she received treatments (IV insulin infusion) for hyperglycemic episode that placed patinet her in a comatose state. After medical evaluation, treatments & stabilization, Kathryn Gilbert was recommended & transferred to the [facility] for further psychiatric evaluation/treatments.   Principal Problem: MDD (major depressive disorder) Discharge Diagnoses: Principal Problem:   MDD (major depressive disorder) Active Problems:   Severe benzodiazepine use disorder (HCC)   Opioid use disorder, severe, in controlled environment [facility])   Past Psychiatric History:  Patient reports depressed since 2017, however, reports being more depressed since the deaths of her parents in 2019 & 2020 respectively. Medications tried since 2017 include; Fluoxetine, Sertraline & Lexapro. However, once her doctor realized that she was pregnant, was switched from Lexapro to Sertraline. However, she says Wellbutrin was added to her regimen in March of this year 2024 after she complained of not having any motivation to do anything. Patient reports has been on both Wellbutrin & Sertraline, but have not been on her medications in  3 weeks. Says she missed appointment & better health provider refused to refill her prescription.  Prior Inpatient Therapy: No. If yes, describe: NA   Prior Outpatient Therapy: Yes.   If yes, describe: [facility] via online appointments.    Past Medical History:  Past Medical History:  Diagnosis Date   Diabetes mellitus without complication (HCC)    Type 1   Herpes simplex virus type 1 (HSV-1) dermatitis    Hx of varicella    Kidney stones    age 50    Past Surgical History:  Procedure Laterality Date   CESAREAN SECTION     Family History:  Family History  Problem Relation Age of Onset   Hypertension Mother    Cancer Mother        skin   Hypertension Father    Family Psychiatric  History:  Patient reports depression & anxiety run on the paternal side of the family    Social History:  Social History   Substance and Sexual Activity  Alcohol Use Not Currently     Social History   Substance and Sexual Activity  Drug Use Yes   Types: Oxycodone   Comment: reports regular use of illicit Rx opiates    Social History   Socioeconomic History   Marital status: Married    Spouse name: Not on file   Number of children: 3   Years of education: Not on file   Highest education level: Not on file  Occupational History   Not on file  Tobacco Use   Smoking status: Every Day    Packs/day: 1.50    Years: 15.00    Additional pack years: 0.00  Total pack years: 22.50    Types: Cigarettes    Last attempt to quit: 01/23/2012    Years since quitting: 10.7    Passive exposure: Past   Smokeless tobacco: Never  Vaping Use   Vaping Use: Never used  Substance and Sexual Activity   Alcohol use: Not Currently   Drug use: Yes    Types: Oxycodone    Comment: reports regular use of illicit Rx opiates   Sexual activity: Yes  Other Topics Concern   Not on file  Social History Narrative   Not on file   Social Determinants of Health   Financial Resource Strain: Not on file   Food Insecurity: No Food Insecurity (10/25/2022)   Hunger Vital Sign    Worried About Running Out of Food in the Last Year: Never true    Ran Out of Food in the Last Year: Never true  Transportation Needs: No Transportation Needs (10/25/2022)   PRAPARE - Administrator, Civil Service (Medical): No    Lack of Transportation (Non-Medical): No  Physical Activity: Not on file  Stress: Not on file  Social Connections: Not on file    Hospital Course:   During the patient's hospitalization, patient had extensive initial psychiatric evaluation, and follow-up psychiatric evaluations every day.  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  Opioid use disorder  Benzodiazepine use disorder.   Patient's psychiatric medications were adjusted on admission:  Discontinued Wellbutrin XL 150 mg.  Initiated Wellbutrin SR 100 mg po daily for depression.  Discontinued Sertraline 100 mg. Initiated Prozac 20 mg po daily for depression. Continue Hydroxyzine 25 mg po tid prn for anxiety.  Continue Trazodone 50 mg po Q hs prn for insomnia.  Continue Nicoderm patch 14 mg topically Q 24 hrs for Continue Clonidine 0.1 mg po Q 4 hrs prn for opioid withdrawal management.    During the hospitalization, other adjustments were made to the patient's psychiatric medication regimen:  -prozac was increased to 40 mg once daily -gabapentin was started and increased to 300 mg tid   Patient's care was discussed during the interdisciplinary team meeting every day during the hospitalization.  The patient denied having side effects to prescribed psychiatric medication.  Gradually, patient started adjusting to milieu. The patient was evaluated each day by a clinical provider to ascertain response to treatment. Improvement was noted by the patient's report of decreasing symptoms, improved sleep and appetite, affect, medication tolerance,  behavior, and participation in unit programming.  Patient was asked each day to complete a self inventory noting mood, mental status, pain, new symptoms, anxiety and concerns.    Symptoms were reported as significantly decreased or resolved completely by discharge.   On day of discharge, the patient reports that their mood is stable. The patient denied having suicidal thoughts for more than 48 hours prior to discharge.  Patient denies having homicidal thoughts.  Patient denies having auditory hallucinations.  Patient denies any visual hallucinations or other symptoms of psychosis. The patient was motivated to continue taking medication with a goal of continued improvement in mental health.   The patient reports their target psychiatric symptoms of depression and suicidal thoughts, all responded well to the psychiatric medications, and the patient reports overall benefit other psychiatric hospitalization. Supportive psychotherapy was provided to the patient. The patient also participated in regular group therapy while hospitalized. Coping skills, problem solving as well as relaxation therapies were also part of the unit  programming.  Labs were reviewed with the patient, and abnormal results were discussed with the patient.  The patient is able to verbalize their individual safety plan to this provider.  # It is recommended to the patient to continue psychiatric medications as prescribed, after discharge from the hospital.    # It is recommended to the patient to follow up with your outpatient psychiatric provider and PCP.  # It was discussed with the patient, the impact of alcohol, drugs, tobacco have been there overall psychiatric and medical wellbeing, and total abstinence from substance use was recommended the patient.ed.  # Prescriptions provided or sent directly to preferred pharmacy at discharge. Patient agreeable to plan. Given opportunity to ask questions. Appears to feel comfortable with  discharge.    # In the event of worsening symptoms, the patient is instructed to call the crisis hotline, 911 and or go to the nearest ED for appropriate evaluation and treatment of symptoms. To follow-up with primary care provider for other medical issues, concerns and or health care needs  # Patient was discharged to home, with a plan to follow up as noted below.   Physical Findings: AIMS:  , ,  ,  ,    CIWA:    COWS:  COWS Total Score: 0  Musculoskeletal: Strength & Muscle Tone: within normal limits Gait & Station: normal Patient leans: N/A   Psychiatric Specialty Exam:  Presentation  General Appearance:  Appropriate for Environment; Casual; Fairly Groomed  Eye Contact: Good  Speech: Normal Rate; Clear and Coherent  Speech Volume: Normal  Handedness: Right   Mood and Affect  Mood: Euthymic  Affect: Appropriate; Congruent; Full Range   Thought Process  Thought Processes: Linear  Descriptions of Associations:Intact  Orientation:Full (Time, Place and Person)  Thought Content:Logical  History of Schizophrenia/Schizoaffective disorder:No data recorded Duration of Psychotic Symptoms:No data recorded Hallucinations:Hallucinations: None  Ideas of Reference:None  Suicidal Thoughts:Suicidal Thoughts: No  Homicidal Thoughts:Homicidal Thoughts: No   Sensorium  Memory: Immediate Good; Recent Good; Remote Good  Judgment: Good  Insight: Good   Executive Functions  Concentration: Good  Attention Span: Good  Recall: Good  Fund of Knowledge: Good  Language: Good   Psychomotor Activity  Psychomotor Activity: Psychomotor Activity: Normal   Assets  Assets: Communication Skills; Desire for Improvement; Financial Resources/Insurance; Housing; Resilience; Social Support   Sleep  Sleep: Sleep: Fair    Physical Exam: Physical Exam Vitals reviewed.  Constitutional:      General: She is not in acute distress.    Appearance: She  is normal weight. She is not toxic-appearing.  Pulmonary:     Effort: Pulmonary effort is normal.  Neurological:     Mental Status: She is alert.     Motor: No weakness.     Gait: Gait normal.  Psychiatric:        Mood and Affect: Mood normal.        Behavior: Behavior normal.        Thought Content: Thought content normal.        Judgment: Judgment normal.    Review of Systems  Constitutional:  Negative for chills and fever.  Cardiovascular:  Negative for chest pain and palpitations.  Neurological:  Negative for dizziness, tingling, tremors and headaches.  Psychiatric/Behavioral:  Negative for depression, hallucinations, memory loss, substance abuse and suicidal ideas. The patient is not nervous/anxious and does not have insomnia.   All other systems reviewed and are negative.  Blood pressure 131/85, pulse 79, temperature 98.4 F (  36.9 C), temperature source Oral, resp. rate 16, height 5\' 4"  (1.626 m), weight 62.6 kg, last menstrual period 10/16/2022, SpO2 100 %, not currently breastfeeding. Body mass index is 23.69 kg/m.   Social History   Tobacco Use  Smoking Status Every Day   Packs/day: 1.50   Years: 15.00   Additional pack years: 0.00   Total pack years: 22.50   Types: Cigarettes   Last attempt to quit: 01/23/2012   Years since quitting: 10.7   Passive exposure: Past  Smokeless Tobacco Never   Tobacco Cessation:  A prescription for an FDA-approved tobacco cessation medication provided at discharge   Blood Alcohol level:  Lab Results  Component Value Date   ETH 246 (H) 07/30/2011    Metabolic Disorder Labs:  Lab Results  Component Value Date   HGBA1C 9.6 (H) 10/25/2022   MPG 229 10/25/2022   No results found for: "PROLACTIN" No results found for: "CHOL", "TRIG", "HDL", "CHOLHDL", "VLDL", "LDLCALC"  See Psychiatric Specialty Exam and Suicide Risk Assessment completed by Attending Physician prior to discharge.  Discharge destination:  Home  Is patient on  multiple antipsychotic therapies at discharge:  No   Has Patient had three or more failed trials of antipsychotic monotherapy by history:  No  Recommended Plan for Multiple Antipsychotic Therapies: NA  Discharge Instructions     Diet - low sodium heart healthy   Complete by: As directed    Increase activity slowly   Complete by: As directed       Allergies as of 10/29/2022   No Known Allergies      Medication List     STOP taking these medications    buPROPion 150 MG 24 hr tablet Commonly known as: WELLBUTRIN XL Replaced by: buPROPion ER 100 MG 12 hr tablet   sertraline 100 MG tablet Commonly known as: ZOLOFT       TAKE these medications      Indication  buPROPion ER 100 MG 12 hr tablet Commonly known as: WELLBUTRIN SR Take 1 tablet (100 mg total) by mouth daily. Start taking on: October 30, 2022 Replaces: buPROPion 150 MG 24 hr tablet  Indication: Major Depressive Disorder   Dexcom G6 Transmitter Misc EVERY DAY AT BEDTIME**PA IN PROCESS  Indication: glucose monitoring   FLUoxetine 40 MG capsule Commonly known as: PROZAC Take 1 capsule (40 mg total) by mouth daily. Start taking on: October 30, 2022  Indication: Depression   gabapentin 300 MG capsule Commonly known as: NEURONTIN Take 1 capsule (300 mg total) by mouth every 8 (eight) hours.  Indication: Generalized Anxiety Disorder   hydrOXYzine 25 MG tablet Commonly known as: ATARAX Take 1 tablet (25 mg total) by mouth 3 (three) times daily as needed for anxiety.  Indication: Feeling Anxious   insulin aspart 100 UNIT/ML injection Commonly known as: NovoLOG Inject 1.8 Units into the skin continuous. Insulin pump 1.8 units/hour. 1unit for every 10 gram of carbohydrates. 1 unit for every 40 >150 sliding scale What changed: additional instructions  Indication: Type 2 Diabetes   lisinopril 5 MG tablet Commonly known as: ZESTRIL Take 1 tablet (5 mg total) by mouth daily. Start taking on: October 30, 2022   Indication: High Blood Pressure Disorder   nicotine polacrilex 2 MG gum Commonly known as: NICORETTE Take 1 each (2 mg total) by mouth as needed for smoking cessation.  Indication: Nicotine Addiction   traZODone 50 MG tablet Commonly known as: DESYREL Take 1 tablet (50 mg total) by mouth at bedtime  as needed for sleep.  Indication: Trouble Sleeping        Follow-up Information     [facility] Follow up on 10/30/2022.   Why: You have an appointment for an assessment screening on 10/30/22 at 3:00 pm.  This will be a Virtual teleheath appt. Contact information: [address], [place], [place] [address]  Phone: [phone] Fax: [phone]                Follow-up recommendations:    Activity: as tolerated  Diet: heart healthy  Other: -Follow-up with your outpatient psychiatric provider -instructions on appointment date, time, and address (location) are provided to you in discharge paperwork.  -Take your psychiatric medications as prescribed at discharge - instructions are provided to you in the discharge paperwork  -Follow-up with outpatient primary care doctor and other specialists -for management of preventative medicine and chronic medical disease  -If you are prescribed an atypical antipsychotic medication, we recommend that your outpatient psychiatrist follow routine screening for side effects within 3 months of discharge, including monitoring: AIMS scale, height, weight, blood pressure, fasting lipid panel, HbA1c, and fasting blood sugar.   -Recommend total abstinence from alcohol, tobacco, and other illicit drug use at discharge.   -If your psychiatric symptoms recur, worsen, or if you have side effects to your psychiatric medications, call your outpatient psychiatric provider, 911, [phone] or go to the nearest emergency department.  -If suicidal thoughts occur, immediately call your outpatient psychiatric  provider, 911, [phone] or go to the nearest emergency department.   Signed: Cristy Hilts, MD 10/29/2022, 8:15 AM  Total Time Spent in Direct Patient Care:  I personally spent 35 minutes on the unit in direct patient care. The direct patient care time included face-to-face time with the patient, reviewing the patient's chart, communicating with other professionals, and coordinating care. Greater than 50% of this time was spent in counseling or coordinating care with the patient regarding goals of hospitalization, psycho-education, and discharge planning needs.   Phineas Inches, MD Psychiatrist
